# Patient Record
Sex: Female | Born: 1995 | Race: White | Hispanic: No | Marital: Married | Smoking: Former smoker
Health system: Southern US, Community
[De-identification: ages and names within clinical notes are randomized; demographics above are authoritative.]

## PROBLEM LIST (undated history)

## (undated) DIAGNOSIS — O24419 Gestational diabetes mellitus in pregnancy, unspecified control: Secondary | ICD-10-CM

## (undated) DIAGNOSIS — Z789 Other specified health status: Secondary | ICD-10-CM

## (undated) HISTORY — DX: Other specified health status: Z78.9

## (undated) HISTORY — PX: NO PAST SURGERIES: SHX2092

## (undated) HISTORY — DX: Gestational diabetes mellitus in pregnancy, unspecified control: O24.419

## (undated) HISTORY — PX: WISDOM TOOTH EXTRACTION: SHX21

---
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
## 2013-06-09 ENCOUNTER — Ambulatory Visit: Payer: Self-pay | Admitting: Psychiatry

## 2013-06-22 ENCOUNTER — Ambulatory Visit: Payer: Self-pay | Admitting: Psychiatry

## 2013-09-18 ENCOUNTER — Ambulatory Visit: Payer: Self-pay | Admitting: Physician Assistant

## 2015-01-25 ENCOUNTER — Encounter: Payer: Self-pay | Admitting: *Deleted

## 2015-01-30 ENCOUNTER — Encounter: Payer: Self-pay | Admitting: Obstetrics and Gynecology

## 2015-11-17 ENCOUNTER — Ambulatory Visit
Admission: EM | Admit: 2015-11-17 | Discharge: 2015-11-17 | Disposition: A | Discharge status: Home / Self Care | Payer: 59 | Attending: Family Medicine | Admitting: Family Medicine

## 2015-11-17 DIAGNOSIS — J0100 Acute maxillary sinusitis, unspecified: Secondary | ICD-10-CM | POA: Diagnosis not present

## 2015-11-17 DIAGNOSIS — H6093 Unspecified otitis externa, bilateral: Secondary | ICD-10-CM | POA: Diagnosis not present

## 2015-11-17 MED ORDER — CIPROFLOXACIN-DEXAMETHASONE 0.3-0.1 % OT SUSP: 4.0000 [drp] | Freq: Two times a day (BID) | OTIC | Status: AC

## 2015-11-17 MED ORDER — AMOXICILLIN-POT CLAVULANATE 875-125 MG PO TABS: 1.0000 | ORAL_TABLET | Freq: Two times a day (BID) | ORAL | Status: DC

## 2015-11-17 NOTE — ED Notes (Signed)
Patient complains of some ear pain. Patient states that symptoms started 6 days with a cold, since then cold has resolved but ears have still been bothering her. Patient states that she feels like they are full of fluid.

## 2015-11-17 NOTE — Discharge Instructions (Signed)
Take medication as prescribed. Rest. Drink plenty of fluids. Keep ears dry.   Follow up with your primary care physician this week. Return to Urgent care for new or worsening concerns.    Otitis Externa Otitis externa is a bacterial or fungal infection of the outer ear canal. This is the area from the eardrum to the outside of the ear. Otitis externa is sometimes called "swimmer's ear." CAUSES  Possible causes of infection include:  Swimming in dirty water.  Moisture remaining in the ear after swimming or bathing.  Mild injury (trauma) to the ear.  Objects stuck in the ear (foreign body).  Cuts or scrapes (abrasions) on the outside of the ear. SIGNS AND SYMPTOMS  The first symptom of infection is often itching in the ear canal. Later signs and symptoms may include swelling and redness of the ear canal, ear pain, and yellowish-white fluid (pus) coming from the ear. The ear pain may be worse when pulling on the earlobe. DIAGNOSIS  Your health care provider will perform a physical exam. A sample of fluid may be taken from the ear and examined for bacteria or fungi. TREATMENT  Antibiotic ear drops are often given for 10 to 14 days. Treatment may also include pain medicine or corticosteroids to reduce itching and swelling. HOME CARE INSTRUCTIONS   Apply antibiotic ear drops to the ear canal as prescribed by your health care provider.  Take medicines only as directed by your health care provider.  If you have diabetes, follow any additional treatment instructions from your health care provider.  Keep all follow-up visits as directed by your health care provider. PREVENTION   Keep your ear dry. Use the corner of a towel to absorb water out of the ear canal after swimming or bathing.  Avoid scratching or putting objects inside your ear. This can damage the ear canal or remove the protective wax that lines the canal. This makes it easier for bacteria and fungi to grow.  Avoid swimming  in lakes, polluted water, or poorly chlorinated pools.  You may use ear drops made of rubbing alcohol and vinegar after swimming. Combine equal parts of white vinegar and alcohol in a bottle. Put 3 or 4 drops into each ear after swimming. SEEK MEDICAL CARE IF:   You have a fever.  Your ear is still red, swollen, painful, or draining pus after 3 days.  Your redness, swelling, or pain gets worse.  You have a severe headache.  You have redness, swelling, pain, or tenderness in the area behind your ear. MAKE SURE YOU:   Understand these instructions.  Will watch your condition.  Will get help right away if you are not doing well or get worse.   This information is not intended to replace advice given to you by your health care provider. Make sure you discuss any questions you have with your health care provider.   Document Released: 06/08/2005 Document Revised: 06/29/2014 Document Reviewed: 06/25/2011 Elsevier Interactive Patient Education 2016 [employer]  Sinusitis, Adult Sinusitis is redness, soreness, and inflammation of the paranasal sinuses. Paranasal sinuses are air pockets within the bones of your face. They are located beneath your eyes, in the middle of your forehead, and above your eyes. In healthy paranasal sinuses, mucus is able to drain out, and air is able to circulate through them by way of your nose. However, when your paranasal sinuses are inflamed, mucus and air can become trapped. This can allow bacteria and other germs to grow and  cause infection. Sinusitis can develop quickly and last only a short time (acute) or continue over a long period (chronic). Sinusitis that lasts for more than 12 weeks is considered chronic. CAUSES Causes of sinusitis include:  Allergies.  Structural abnormalities, such as displacement of the cartilage that separates your nostrils (deviated septum), which can decrease the air flow through your nose and sinuses and affect sinus  drainage.  Functional abnormalities, such as when the small hairs (cilia) that line your sinuses and help remove mucus do not work properly or are not present. SIGNS AND SYMPTOMS Symptoms of acute and chronic sinusitis are the same. The primary symptoms are pain and pressure around the affected sinuses. Other symptoms include:  Upper toothache.  Earache.  Headache.  Bad breath.  Decreased sense of smell and taste.  A cough, which worsens when you are lying flat.  Fatigue.  Fever.  Thick drainage from your nose, which often is green and may contain pus (purulent).  Swelling and warmth over the affected sinuses. DIAGNOSIS Your health care provider will perform a physical exam. During your exam, your health care provider may perform any of the following to help determine if you have acute sinusitis or chronic sinusitis:  Look in your nose for signs of abnormal growths in your nostrils (nasal polyps).  Tap over the affected sinus to check for signs of infection.  View the inside of your sinuses using an imaging device that has a light attached (endoscope). If your health care provider suspects that you have chronic sinusitis, one or more of the following tests may be recommended:  Allergy tests.  Nasal culture. A sample of mucus is taken from your nose, sent to a lab, and screened for bacteria.  Nasal cytology. A sample of mucus is taken from your nose and examined by your health care provider to determine if your sinusitis is related to an allergy. TREATMENT Most cases of acute sinusitis are related to a viral infection and will resolve on their own within 10 days. Sometimes, medicines are prescribed to help relieve symptoms of both acute and chronic sinusitis. These may include pain medicines, decongestants, nasal steroid sprays, or saline sprays. However, for sinusitis related to a bacterial infection, your health care provider will prescribe antibiotic medicines. These are  medicines that will help kill the bacteria causing the infection. Rarely, sinusitis is caused by a fungal infection. In these cases, your health care provider will prescribe antifungal medicine. For some cases of chronic sinusitis, surgery is needed. Generally, these are cases in which sinusitis recurs more than 3 times per year, despite other treatments. HOME CARE INSTRUCTIONS  Drink plenty of water. Water helps thin the mucus so your sinuses can drain more easily.  Use a humidifier.  Inhale steam 3-4 times a day (for example, sit in the bathroom with the shower running).  Apply a warm, moist washcloth to your face 3-4 times a day, or as directed by your health care provider.  Use saline nasal sprays to help moisten and clean your sinuses.  Take medicines only as directed by your health care provider.  If you were prescribed either an antibiotic or antifungal medicine, finish it all even if you start to feel better. SEEK IMMEDIATE MEDICAL CARE IF:  You have increasing pain or severe headaches.  You have nausea, vomiting, or drowsiness.  You have swelling around your face.  You have vision problems.  You have a stiff neck.  You have difficulty breathing.   This information  is not intended to replace advice given to you by your health care provider. Make sure you discuss any questions you have with your health care provider.   Document Released: 06/08/2005 Document Revised: 06/29/2014 Document Reviewed: 06/23/2011 Elsevier Interactive Patient Education [employer]

## 2015-11-17 NOTE — ED Provider Notes (Signed)
[facility]  ____________________________________________  Time seen: Approximately 3:49 PM  I have reviewed the triage vital signs and the nursing notes.   HISTORY  Chief Complaint Otalgia  HPI Martha Finley is a 20 y.o. female  presents for complaints of 7-8 days of runny nose, runny nose, sinus pressure and sinus drainage. Reports overall her sinus congestion symptoms have improved but continues with sinus discomfort and drainage. however reports in the last 3-4 days she has had increased congestion and fluid filling in both her ears. Reports does continue to present blow her nose and a very thick greenish yellowish nasal drainage out. Reports occasional cough associated with postnasal drainage. Denies fevers.  Denies any head or ear trauma. Denies ringing in the ears. Denies recent swimming or water submersion.   Denies chest pain, shortness of breath, abdominal pain, dysuria, neck pain, back pain, extremity pain or extremity swelling. Patient also reports some history of seasonal allergies. Reports has tried over the counter cough and congestion medications without any relief. Reports continues to eat and drink well. Denies others at home that are sick.   Patient's last menstrual period was 11/12/2015.Denies chance of pregnancy.   History reviewed. No pertinent past medical history.  There are no active problems to display for this patient.   Past Surgical History  Procedure Laterality Date  . No past surgeries      Current Outpatient Rx  Name  Route  Sig  Dispense  Refill  . etonogestrel (NEXPLANON) 68 MG IMPL implant   Subdermal   1 each by Subdermal route once.           Allergies Review of patient's allergies indicates no known allergies.  Family History  Problem Relation Age of Onset  . Diabetes Maternal Grandmother   . COPD Maternal Grandmother     Social History Social History  Substance Use Topics  . Smoking status: Former Games developermoker  . Smokeless  tobacco: Never Used  . Alcohol Use: No    Review of Systems Constitutional: No fever/chills Eyes: No visual changes. ENT:As Above.  Cardiovascular: Denies chest pain. Respiratory: Denies shortness of breath. Gastrointestinal: No abdominal pain.  No nausea, no vomiting.  No diarrhea.  No constipation. Genitourinary: Negative for dysuria. Musculoskeletal: Negative for back pain. Skin: Negative for rash. Neurological: Negative for headaches, focal weakness or numbness.  10-point ROS otherwise negative.  ____________________________________________   PHYSICAL EXAM:  VITAL SIGNS: ED Triage Vitals  Enc Vitals Group     BP 11/17/15 1522 123/68 mmHg     Pulse Rate 11/17/15 1522 91     Resp 11/17/15 1522 17     Temp 11/17/15 1522 98.2 F (36.8 C)     Temp Source 11/17/15 1522 Oral     SpO2 11/17/15 1522 99 %     Weight 11/17/15 1522 170 lb (77.111 kg)     Height 11/17/15 1522 5\' 6"  (1.676 m)     Head Cir --      Peak Flow --      Pain Score 11/17/15 1524 0     Pain Loc --      Pain Edu? --      Excl. in GC? --   Constitutional: Alert and oriented. Well appearing and in no acute distress. Eyes: Conjunctivae are normal. PERRL. EOMI. Head: Atraumatic.Mild to moderate tenderness to palpation bilateral frontal and maxillary sinuses. No swelling. No erythema.   Ears: Bilateral ear canals with mild swelling and mild whitish discharge, bilateral TMs nonerythematous and  normal appearing. Bilateral TMs appear intact. No tenderness,  erythema or swelling bilaterally. Hearing grossly intact bilaterally.   Nose: nasal congestion with bilateral nasal turbinate erythema and edema.   Mouth/Throat: Mucous membranes are moist.  Oropharynx non-erythematous.No tonsillar swelling or exudate.  Neck: No stridor.  No cervical spine tenderness to palpation. Hematological/Lymphatic/Immunilogical: No cervical lymphadenopathy. Cardiovascular: Normal rate, regular rhythm. Grossly normal heart sounds.   Good peripheral circulation. Respiratory: Normal respiratory effort.  No retractions. Lungs CTAB. No wheezes, rales or rhonchi. Good air movement.  Gastrointestinal: Soft and nontender. No distention. Normal Bowel sounds. No CVA tenderness. Musculoskeletal: No lower or upper extremity tenderness nor edema.   No cervical, thoracic or lumbar tenderness to palpation.  Neurologic:  Normal speech and language. No gross focal neurologic deficits are appreciated. No gait instability. Skin:  Skin is warm, dry and intact. No rash noted. Psychiatric: Mood and affect are normal. Speech and behavior are normal.  ____________________________________________   LABS (all labs ordered are listed, but only abnormal results are displayed)  Labs Reviewed - No data to display   INITIAL IMPRESSION / ASSESSMENT AND PLAN / ED COURSE  Pertinent labs & imaging results that were available during my care of the patient were reviewed by me and considered in my medical decision making (see chart for detail.)  Well-appearing patient. No acute distress. Presents for complaint of one week of runny nose, nasal congestion, sinus pressure and sinus drainage with gradual onset of bilateral ear discomfort. Suspect maxillary sinusitis with bilateral mild otitis externa. Will treat with oral Augmentin and Ciprodex. Encouraged rest, fluids, over-the-counter Tylenol or ibuprofen as needed. Encourage PCP follow-up as needed.Discussed indication, risks and benefits of medications with patient.  Discussed follow up with Primary care physician this week. Discussed follow up and return parameters including no resolution or any worsening concerns. Patient verbalized understanding and agreed to plan.   ____________________________________________   FINAL CLINICAL IMPRESSION(S) / ED DIAGNOSES  Final diagnoses:  Acute maxillary sinusitis, recurrence not specified  External otitis, bilateral     Discharge Medication List as of  11/17/2015  3:53 PM    START taking these medications   Details  amoxicillin-clavulanate (AUGMENTIN) 875-125 MG tablet Take 1 tablet by mouth every 12 (twelve) hours., Starting 11/17/2015, Until Discontinued, Normal    ciprofloxacin-dexamethasone (CIPRODEX) otic suspension Place 4 drops into both ears 2 (two) times daily. For one week, Starting 11/17/2015, Until Sun 11/24/15, Normal        Note: This dictation was prepared with Dragon dictation along with smaller phrase technology. Any transcriptional errors that result from this process are unintentional.       Renford Dills, NP 11/17/15 1941  Renford Dills, NP 11/17/15 1941

## 2016-08-21 ENCOUNTER — Ambulatory Visit
Admission: EM | Admit: 2016-08-21 | Discharge: 2016-08-21 | Disposition: A | Discharge status: Home / Self Care | Payer: No Typology Code available for payment source | Attending: Family Medicine | Admitting: Family Medicine

## 2016-08-21 ENCOUNTER — Encounter: Payer: Self-pay | Admitting: *Deleted

## 2016-08-21 ENCOUNTER — Ambulatory Visit (INDEPENDENT_AMBULATORY_CARE_PROVIDER_SITE_OTHER): Payer: No Typology Code available for payment source

## 2016-08-21 DIAGNOSIS — J101 Influenza due to other identified influenza virus with other respiratory manifestations: Secondary | ICD-10-CM | POA: Diagnosis not present

## 2016-08-21 DIAGNOSIS — R6889 Other general symptoms and signs: Secondary | ICD-10-CM

## 2016-08-21 LAB — RAPID INFLUENZA A&B ANTIGENS
Influenza A (ARMC): POSITIVE — AB
Influenza B (ARMC): NEGATIVE

## 2016-08-21 MED ORDER — FEXOFENADINE-PSEUDOEPHED ER 180-240 MG PO TB24: 1.0000 | ORAL_TABLET | Freq: Every day | ORAL | 0 refills | Status: DC

## 2016-08-21 MED ORDER — IBUPROFEN 400 MG PO TABS
400.0000 mg | ORAL_TABLET | Freq: Once | ORAL | Status: AC
  Administered 2016-08-21: 400 mg via ORAL

## 2016-08-21 MED ORDER — HYDROCOD POLST-CPM POLST ER 10-8 MG/5ML PO SUER: 5.0000 mL | Freq: Two times a day (BID) | ORAL | 0 refills | Status: DC | PRN

## 2016-08-21 MED ORDER — OSELTAMIVIR PHOSPHATE 75 MG PO CAPS: 75.0000 mg | ORAL_CAPSULE | Freq: Two times a day (BID) | ORAL | 0 refills | Status: DC

## 2016-08-21 NOTE — ED Provider Notes (Signed)
[facility]    CSN: [id number] Arrival date & time: 08/21/16  1121     History   Chief Complaint Chief Complaint  Patient presents with  . Cough  . Fever  . Generalized Body Aches    HPI Martha Finley is a 21 y.o. female.   Patient is a 21 year old white female reports happens more mild coughing since she had a cough about 2 weeks ago cleared in about 3 days ago she started coughing again came back worse. And now today started having fever aching  just a slight sore throat. She states that she's aching she states that she takes when she coughs more she's not coughing there is no aching in her arms and legs don't hurt. She states she was told when she got here she had a fever she had no fever home. She does not smoke no known drug allergies. Subcutaneous was disconcerting was the fact that she's not aching all over and just in her chest and her when she takes deep breath. She does not smoke now but she is a former smoker no known drug allergies no chronic medical problems. His diabetes and COPD maternal grandmother.   The history is provided by the patient. No language interpreter was used.  Cough  Cough characteristics:  Productive Sputum characteristics:  Nondescript Severity:  Moderate Timing:  Constant Progression:  Worsening Chronicity:  New Smoker: no   Context: upper respiratory infection   Relieved by:  Nothing Worsened by:  Deep breathing Associated symptoms: chills, fever, myalgias, shortness of breath and sinus congestion   Risk factors: recent infection   Fever  Associated symptoms: chills, congestion, cough and myalgias     History reviewed. No pertinent past medical history.  There are no active problems to display for this patient.   Past Surgical History:  Procedure Laterality Date  . NO PAST SURGERIES      OB History    No data available       Home Medications    Prior to Admission medications   Medication Sig Start Date End Date  Taking? Authorizing Provider  amoxicillin-clavulanate (AUGMENTIN) 875-125 MG tablet Take 1 tablet by mouth every 12 (twelve) hours. 11/17/15   Renford Dills, NP  chlorpheniramine-HYDROcodone [facility] PENNKINETIC ER) 10-8 MG/5ML SUER Take 5 mLs by mouth every 12 (twelve) hours as needed. 08/21/16   Hassan Rowan, MD  etonogestrel (NEXPLANON) 68 MG IMPL implant 1 each by Subdermal route once.    Historical Provider, MD  fexofenadine-pseudoephedrine (ALLEGRA-D 24) 180-240 MG 24 hr tablet Take 1 tablet by mouth daily. 08/21/16   Hassan Rowan, MD  oseltamivir (TAMIFLU) 75 MG capsule Take 1 capsule (75 mg total) by mouth 2 (two) times daily. 08/21/16   Hassan Rowan, MD    Family History Family History  Problem Relation Age of Onset  . Diabetes Maternal Grandmother   . COPD Maternal Grandmother     Social History Social History  Substance Use Topics  . Smoking status: Former Games developer  . Smokeless tobacco: Never Used  . Alcohol use No     Allergies   Patient has no known allergies.   Review of Systems Review of Systems  Constitutional: Positive for chills and fever.  HENT: Positive for congestion.   Respiratory: Positive for cough and shortness of breath.   Musculoskeletal: Positive for myalgias.  All other systems reviewed and are negative.    Physical Exam Triage Vital Signs ED Triage Vitals  Enc Vitals Group  BP 08/21/16 1142 (!) 158/90     Pulse Rate 08/21/16 1142 (!) 109     Resp 08/21/16 1142 16     Temp 08/21/16 1142 (!) 102 F (38.9 C)     Temp Source 08/21/16 1142 Oral     SpO2 08/21/16 1142 100 %     Weight 08/21/16 1144 175 lb (79.4 kg)     Height 08/21/16 1144 5\' 6"  (1.676 m)     Head Circumference --      Peak Flow --      Pain Score 08/21/16 1145 0     Pain Loc --      Pain Edu? --      Excl. in GC? --    No data found.   Updated Vital Signs BP (!) 158/90 (BP Location: Right Arm)   Pulse (!) 109   Temp (!) 102 F (38.9 C) (Oral)   Resp 16   Ht 5\' 6"   (1.676 m)   Wt 175 lb (79.4 kg)   LMP 08/12/2016 Comment: denies preg  SpO2 100%   BMI 28.25 kg/m   Visual Acuity Right Eye Distance:   Left Eye Distance:   Bilateral Distance:    Right Eye Near:   Left Eye Near:    Bilateral Near:     Physical Exam  Constitutional: She appears well-developed and well-nourished. She appears distressed.  HENT:  Head: Normocephalic and atraumatic.  Right Ear: Hearing, tympanic membrane, external ear and ear canal normal.  Left Ear: Hearing, tympanic membrane, external ear and ear canal normal.  Nose: Nose normal. No mucosal edema or rhinorrhea.  Mouth/Throat: Uvula is midline, oropharynx is clear and moist and mucous membranes are normal.  Eyes: Pupils are equal, round, and reactive to light.  Neck: Normal range of motion. Neck supple.  Cardiovascular: Normal rate, regular rhythm and normal heart sounds.   Pulmonary/Chest: Effort normal and breath sounds normal.  Neurological: She is alert.  Skin: Skin is warm.  Psychiatric: She has a normal mood and affect.  Vitals reviewed.    UC Treatments / Results  Labs (all labs ordered are listed, but only abnormal results are displayed) Labs Reviewed  RAPID INFLUENZA A&B ANTIGENS ([facility] ONLY) - Abnormal; Notable for the following:       Result Value   Influenza A ([facility]) POSITIVE (*)    All other components within normal limits    EKG  EKG Interpretation None       Radiology Dg Chest 2 View  Result Date: 08/21/2016 CLINICAL DATA:  Cough and fever EXAM: CHEST  2 VIEW COMPARISON:  None. FINDINGS: The heart size and mediastinal contours are within normal limits. Both lungs are clear. The visualized skeletal structures are unremarkable. IMPRESSION: No active cardiopulmonary disease. Electronically Signed   By: Tollie Eth M.D.   On: 08/21/2016 13:03    Procedures Procedures (including critical care time)  Medications Ordered in UC Medications  ibuprofen (ADVIL,MOTRIN) tablet 400 mg (400  mg Oral Given 08/21/16 1151)    Results for orders placed or performed during the hospital encounter of 08/21/16  Rapid Influenza A&B Antigens ([facility] only)  Result Value Ref Range   Influenza A ([facility]) POSITIVE (A) NEGATIVE   Influenza B ([facility]) NEGATIVE NEGATIVE   Initial Impression / Assessment and Plan / UC Course  I have reviewed the triage vital signs and the nursing notes.  Pertinent labs & imaging results that were available during my care of the patient were reviewed by me  and considered in my medical decision making (see chart for details).     Concerned about the history since she only has chest achiness despite she calls a chest x-ray was done which was negative. Flu test that was positive indicating that this is the flu and not a pneumonia. We'll place on Tamiflu for the flu test next the cough 1 teaspoon twice a day Allegra-D for congestion and follow-up PCP if not better in a week work note given for today and tomorrow as well.  Final Clinical Impressions(s) / UC Diagnoses   Final diagnoses:  Flu-like symptoms  Influenza A    New Prescriptions New Prescriptions   CHLORPHENIRAMINE-HYDROCODONE (TUSSIONEX PENNKINETIC ER) 10-8 MG/5ML SUER    Take 5 mLs by mouth every 12 (twelve) hours as needed.   FEXOFENADINE-PSEUDOEPHEDRINE (ALLEGRA-D 24) 180-240 MG 24 HR TABLET    Take 1 tablet by mouth daily.   OSELTAMIVIR (TAMIFLU) 75 MG CAPSULE    Take 1 capsule (75 mg total) by mouth 2 (two) times daily.    Note: This dictation was prepared with Dragon dictation along with smaller phrase technology. Any transcriptional errors that result from this process are unintentional.   Hassan RowanEugene Dalonte Hardage, MD 08/21/16 1334

## 2016-08-21 NOTE — ED Triage Notes (Signed)
Patient started having symptom of cough 5 days ago and started having fever with body aches yesterday.

## 2017-03-17 IMAGING — CR DG CHEST 2V
2 series · 2 of 2 positions shown · non-contrast
Comparison: None.

CLINICAL DATA: Cough and fever

EXAM:
CHEST  2 VIEW

[chest pa]
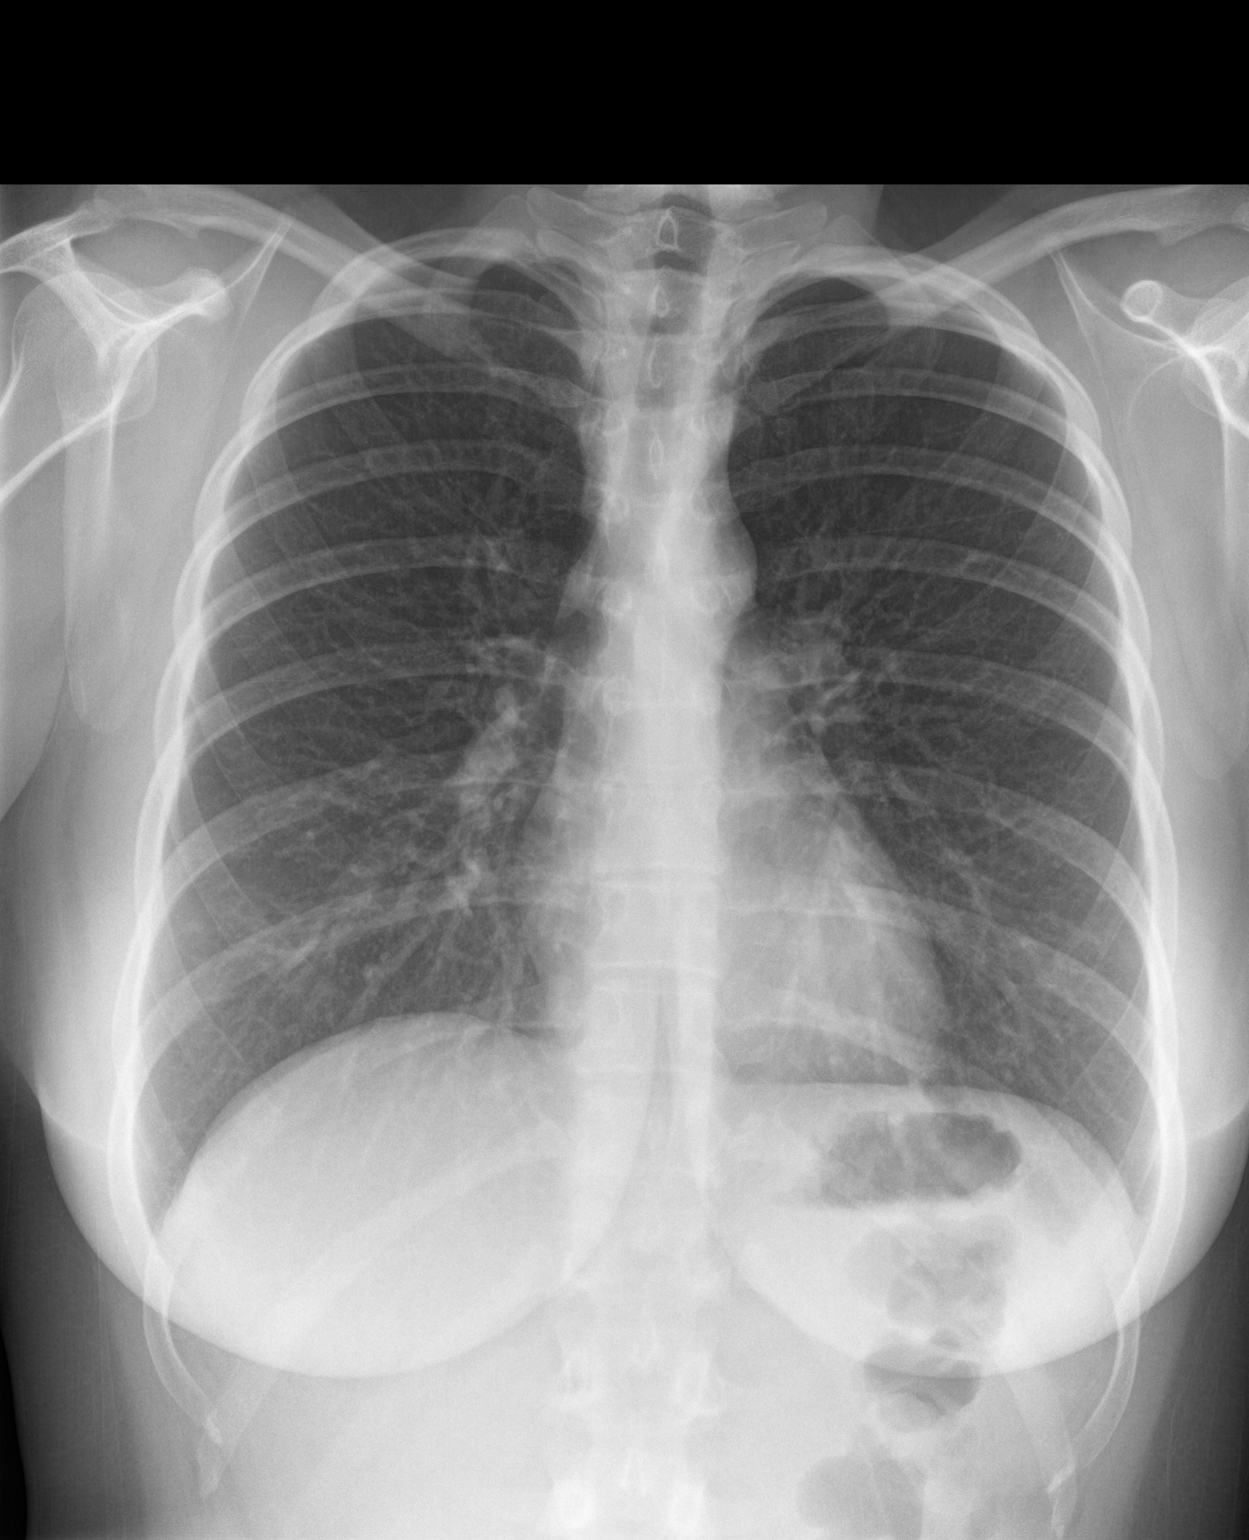

[chest lat]
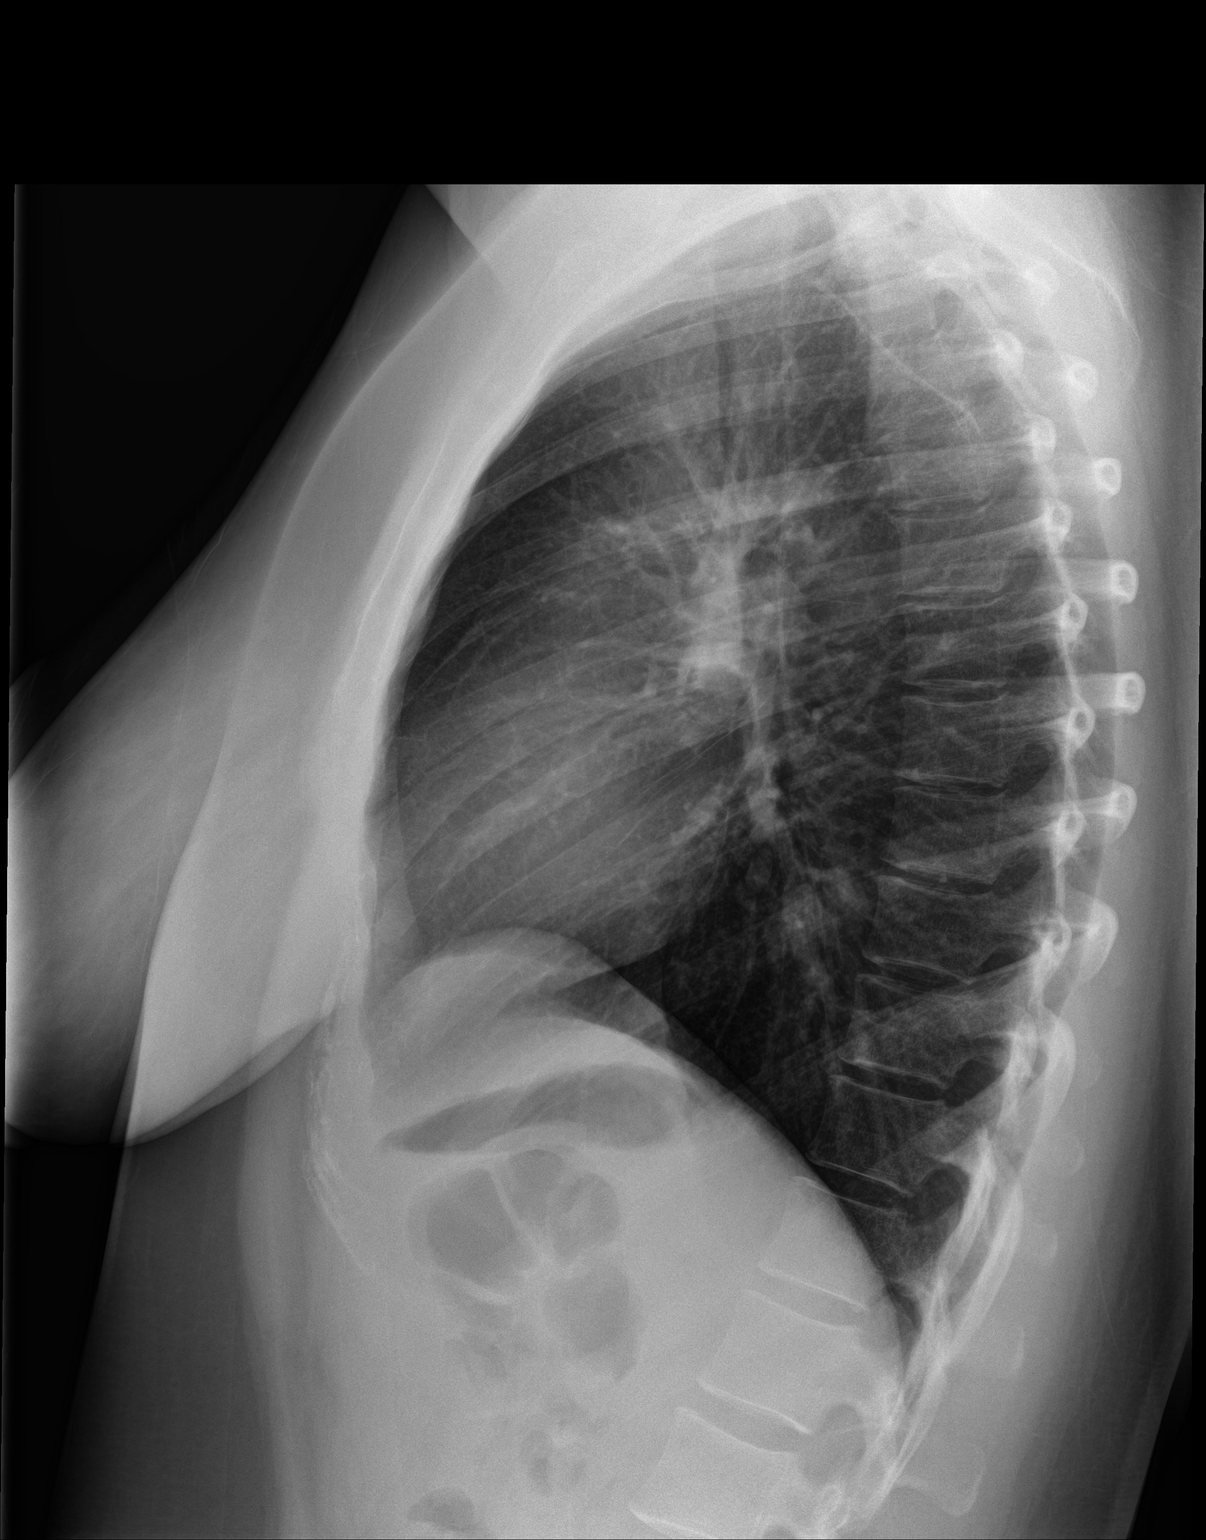

[2 of 2 positions shown; findings below may reference images not displayed]

FINDINGS: The heart size and mediastinal contours are within normal limits.
Both lungs are clear. The visualized skeletal structures are
unremarkable.
IMPRESSION: No active cardiopulmonary disease.

## 2018-07-04 ENCOUNTER — Encounter: Payer: Self-pay | Admitting: Certified Nurse Midwife

## 2018-07-04 ENCOUNTER — Ambulatory Visit (INDEPENDENT_AMBULATORY_CARE_PROVIDER_SITE_OTHER): Payer: BLUE CROSS/BLUE SHIELD | Admitting: Certified Nurse Midwife

## 2018-07-04 VITALS — BP 126/85 | HR 70 | Ht 66.0 in | Wt 178.1 lb

## 2018-07-04 DIAGNOSIS — Z3046 Encounter for surveillance of implantable subdermal contraceptive: Secondary | ICD-10-CM

## 2018-07-04 DIAGNOSIS — Z3049 Encounter for surveillance of other contraceptives: Secondary | ICD-10-CM | POA: Diagnosis not present

## 2018-07-04 MED ORDER — NORETHIN ACE-ETH ESTRAD-FE 1-20 MG-MCG PO TABS: 1.0000 | ORAL_TABLET | Freq: Every day | ORAL | 11 refills | Status: DC

## 2018-07-04 NOTE — Progress Notes (Signed)
Martha Finley is a 23 y.o. year old No obstetric history on file. Caucasian female here for Nexplanon removal and reinsertion.  She was given informed consent for removal. Her Nexplanon was placed 4 yrs ago, Patient's last menstrual period was 06/27/2018 (exact date).,   BP 126/85   Pulse 70   Ht 5\' 6"  (1.676 m)   Wt 178 lb 1 oz (80.8 kg)   LMP 06/27/2018 (Exact Date)   BMI 28.74 kg/m  Patient's last menstrual period was 06/27/2018 (exact date). No results found for this or any previous visit (from the past 24 hour(s)).   Appropriate time out taken. Nexplanon site identified.  Area prepped in usual sterile fashon. Two cc's of 2% lidocaine was used to anesthetize the area. A small stab incision was made right beside the implant on the distal portion.  The Nexplanon rod was grasped using hemostats and removed intact without difficulty.    Steri-strips and a pressure bandage was applied.  There was less than 3 cc blood loss. There were no complications.  The patient tolerated the procedure well.  She was instructed to keep the area clean and dry, remove pressure bandage in 24 hours, and keep insertion site covered with the steri-strips for 3-5 days. Reviewed options for [facility] she would like to start the pill. She has taken in the past with no problems. She denies history of cardiovascular disease, stroke, clotting d/o or cancer. Order placed for lo estrin pill   Follow-up PRN problems.  Doreene Burke, CNM

## 2018-07-04 NOTE — Patient Instructions (Signed)

## 2018-07-12 ENCOUNTER — Ambulatory Visit (INDEPENDENT_AMBULATORY_CARE_PROVIDER_SITE_OTHER): Payer: BLUE CROSS/BLUE SHIELD | Admitting: Certified Nurse Midwife

## 2018-07-12 ENCOUNTER — Other Ambulatory Visit (HOSPITAL_COMMUNITY)
Admission: RE | Admit: 2018-07-12 | Discharge: 2018-07-12 | Disposition: A | Discharge status: Home / Self Care | Payer: BLUE CROSS/BLUE SHIELD | Source: Ambulatory Visit | Attending: Certified Nurse Midwife | Admitting: Certified Nurse Midwife

## 2018-07-12 ENCOUNTER — Encounter: Payer: Self-pay | Admitting: Certified Nurse Midwife

## 2018-07-12 VITALS — BP 129/87 | HR 79 | Ht 66.0 in | Wt 176.2 lb

## 2018-07-12 DIAGNOSIS — Z01419 Encounter for gynecological examination (general) (routine) without abnormal findings: Secondary | ICD-10-CM

## 2018-07-12 NOTE — Patient Instructions (Signed)
Preventive Care 18-39 Years, Female Preventive care refers to lifestyle choices and visits with your health care provider that can promote health and wellness. What does preventive care include?   A yearly physical exam. This is also called an annual well check.  Dental exams once or twice a year.  Routine eye exams. Ask your health care provider how often you should have your eyes checked.  Personal lifestyle choices, including: ? Daily care of your teeth and gums. ? Regular physical activity. ? Eating a healthy diet. ? Avoiding tobacco and drug use. ? Limiting alcohol use. ? Practicing safe sex. ? Taking vitamin and mineral supplements as recommended by your health care provider. What happens during an annual well check? The services and screenings done by your health care provider during your annual well check will depend on your age, overall health, lifestyle risk factors, and family history of disease. Counseling Your health care provider may ask you questions about your:  Alcohol use.  Tobacco use.  Drug use.  Emotional well-being.  Home and relationship well-being.  Sexual activity.  Eating habits.  Work and work environment.  Method of birth control.  Menstrual cycle.  Pregnancy history. Screening You may have the following tests or measurements:  Height, weight, and BMI.  Diabetes screening. This is done by checking your blood sugar (glucose) after you have not eaten for a while (fasting).  Blood pressure.  Lipid and cholesterol levels. These may be checked every 5 years starting at age 20.  Skin check.  Hepatitis C blood test.  Hepatitis B blood test.  Sexually transmitted disease (STD) testing.  BRCA-related cancer screening. This may be done if you have a family history of breast, ovarian, tubal, or peritoneal cancers.  Pelvic exam and Pap test. This may be done every 3 years starting at age 21. Starting at age 30, this may be done every 5  years if you have a Pap test in combination with an HPV test. Discuss your test results, treatment options, and if necessary, the need for more tests with your health care provider. Vaccines Your health care provider may recommend certain vaccines, such as:  Influenza vaccine. This is recommended every year.  Tetanus, diphtheria, and acellular pertussis (Tdap, Td) vaccine. You may need a Td booster every 10 years.  Varicella vaccine. You may need this if you have not been vaccinated.  HPV vaccine. If you are 26 or younger, you may need three doses over 6 months.  Measles, mumps, and rubella (MMR) vaccine. You may need at least one dose of MMR. You may also need a second dose.  Pneumococcal 13-valent conjugate (PCV13) vaccine. You may need this if you have certain conditions and were not previously vaccinated.  Pneumococcal polysaccharide (PPSV23) vaccine. You may need one or two doses if you smoke cigarettes or if you have certain conditions.  Meningococcal vaccine. One dose is recommended if you are age 19-21 years and a first-year college student living in a residence hall, or if you have one of several medical conditions. You may also need additional booster doses.  Hepatitis A vaccine. You may need this if you have certain conditions or if you travel or work in places where you may be exposed to hepatitis A.  Hepatitis B vaccine. You may need this if you have certain conditions or if you travel or work in places where you may be exposed to hepatitis B.  Haemophilus influenzae type b (Hib) vaccine. You may need this if you   have certain risk factors. Talk to your health care provider about which screenings and vaccines you need and how often you need them. This information is not intended to replace advice given to you by your health care provider. Make sure you discuss any questions you have with your health care provider. Document Released: 08/04/2001 Document Revised: 01/19/2017  Document Reviewed: 04/09/2015 Elsevier Interactive Patient Education  2019 [employer].

## 2018-07-12 NOTE — Progress Notes (Signed)
GYNECOLOGY ANNUAL PREVENTATIVE CARE ENCOUNTER NOTE  Subjective:   Martha Finley is a 23 y.o. G0P0000 female here for a routine annual gynecologic exam.  Current complaints: none   Denies abnormal vaginal bleeding, discharge, pelvic pain, problems with intercourse or other gynecologic concerns. Is on bariatric plan for weight loss. Gets vitamin B 12 injections and phentamine for weight loss is 10 lbs from goal weight.    Gynecologic History Patient's last menstrual period was 06/27/2018 (exact date). Contraception: Nexplanon Last Pap: several yrs ago. Results were: normal per pt. Last mammogram: N/A. Results were: normal  Obstetric History OB History  Gravida Para Term Preterm AB Living  0 0 0 0 0 0  SAB TAB Ectopic Multiple Live Births  0 0 0 0 0    Past Medical History:  Diagnosis Date  . Known health problems: none     Past Surgical History:  Procedure Laterality Date  . NO PAST SURGERIES    . WISDOM TOOTH EXTRACTION      Current Outpatient Medications on File Prior to Visit  Medication Sig Dispense Refill  . cyanocobalamin (,VITAMIN B-12,) 1000 MCG/ML injection Inject 1,000 mcg into the muscle once.    . etonogestrel (NEXPLANON) 68 MG IMPL implant 1 each by Subdermal route once.    . norethindrone-ethinyl estradiol (JUNEL FE,GILDESS FE,LOESTRIN FE) 1-20 MG-MCG tablet Take 1 tablet by mouth daily. 1 Package 11  . phentermine (ADIPEX-P) 37.5 MG tablet Take 37.5 mg by mouth daily before breakfast.     No current facility-administered medications on file prior to visit.     No Known Allergies  Social History:  reports that she has quit smoking. She has never used smokeless tobacco. She reports that she does not drink alcohol or use drugs.  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 Diabetes Maternal  Grandmother   . COPD Maternal Grandmother   . Breast cancer Maternal Grandmother     The following portions of the patient's history were reviewed and updated as appropriate: allergies, current medications, past family history, past medical history, past social history, past surgical history and problem list.  Review of Systems Pertinent items noted in HPI and remainder of comprehensive ROS otherwise negative.   Objective:  LMP 06/27/2018 (Exact Date)  CONSTITUTIONAL: Well-developed, well-nourished female in no acute distress.  HENT:  Normocephalic, atraumatic, External right and left ear normal. Oropharynx is clear and moist EYES: Conjunctivae and EOM are normal. Pupils are equal, round, and reactive to light. No scleral icterus.  NECK: Normal range of motion, supple, no masses.  Normal thyroid.  SKIN: Skin is warm and dry. No rash noted. Not diaphoretic. No erythema. No pallor. MUSCULOSKELETAL: Normal range of motion. No tenderness.  No cyanosis, clubbing, or edema.  2+ distal pulses. NEUROLOGIC: Alert and oriented to person, place, and time. Normal reflexes, muscle tone coordination. No cranial nerve deficit noted. PSYCHIATRIC: Normal mood and affect. Normal behavior. Normal judgment and thought content. CARDIOVASCULAR: Normal heart rate noted, regular rhythm RESPIRATORY: Clear to auscultation bilaterally. Effort and breath sounds normal, no problems with respiration noted. BREASTS: Symmetric in size. No masses, skin changes, nipple drainage, or lymphadenopathy. ABDOMEN: Soft, normal bowel sounds, no distention noted.  No tenderness, rebound or guarding.  PELVIC: Normal appearing external genitalia; normal appearing vaginal mucosa and cervix.  No abnormal discharge noted.  Pap smear obtained. Contact bleeding present.  Normal uterine size, no other palpable masses, no uterine or adnexal tenderness.  Assessment and Plan:  Women's annual routine GYN exam Will follow up results of pap  smear and manage accordingly. Mammogram not indicated Labs; States she had cholesterol checked at bariatric clinic  Routine preventative health maintenance measures emphasized. Please refer to After Visit Summary for other counseling recommendations.   Doreene Burke, CNM

## 2018-07-18 ENCOUNTER — Other Ambulatory Visit: Payer: Self-pay | Admitting: Certified Nurse Midwife

## 2018-07-18 LAB — CYTOLOGY - PAP
BACTERIAL VAGINITIS: POSITIVE — AB
Candida vaginitis: NEGATIVE
Chlamydia: NEGATIVE
Diagnosis: NEGATIVE
NEISSERIA GONORRHEA: NEGATIVE
TRICH (WINDOWPATH): NEGATIVE

## 2018-07-18 LAB — CERVICOVAGINAL ANCILLARY ONLY: HERPES (WINDOWPATH): NEGATIVE

## 2018-07-18 MED ORDER — METRONIDAZOLE 500 MG PO TABS: 500.0000 mg | ORAL_TABLET | Freq: Two times a day (BID) | ORAL | 0 refills | Status: AC

## 2018-07-18 NOTE — Progress Notes (Signed)
Pap smear positive for BV, orders placed for treatment. Pt notified via my chart.  Doreene Burke, CNM

## 2019-02-03 ENCOUNTER — Other Ambulatory Visit: Payer: Self-pay

## 2019-02-03 MED ORDER — METRONIDAZOLE 500 MG PO TABS: 500.0000 mg | ORAL_TABLET | Freq: Two times a day (BID) | ORAL | 0 refills | Status: DC

## 2019-05-01 ENCOUNTER — Other Ambulatory Visit: Payer: Self-pay | Admitting: Certified Nurse Midwife

## 2019-12-23 ENCOUNTER — Emergency Department
Admission: EM | Admit: 2019-12-23 | Discharge: 2019-12-23 | Disposition: A | Discharge status: Home / Self Care | Payer: BC Managed Care – PPO | Attending: Emergency Medicine | Admitting: Emergency Medicine

## 2019-12-23 ENCOUNTER — Encounter: Payer: Self-pay | Admitting: Intensive Care

## 2019-12-23 ENCOUNTER — Other Ambulatory Visit: Payer: Self-pay

## 2019-12-23 ENCOUNTER — Emergency Department: Payer: BC Managed Care – PPO

## 2019-12-23 DIAGNOSIS — O99331 Smoking (tobacco) complicating pregnancy, first trimester: Secondary | ICD-10-CM | POA: Insufficient documentation

## 2019-12-23 DIAGNOSIS — Z3A01 Less than 8 weeks gestation of pregnancy: Secondary | ICD-10-CM | POA: Diagnosis not present

## 2019-12-23 DIAGNOSIS — F17210 Nicotine dependence, cigarettes, uncomplicated: Secondary | ICD-10-CM | POA: Insufficient documentation

## 2019-12-23 DIAGNOSIS — O209 Hemorrhage in early pregnancy, unspecified: Secondary | ICD-10-CM | POA: Diagnosis present

## 2019-12-23 DIAGNOSIS — N939 Abnormal uterine and vaginal bleeding, unspecified: Secondary | ICD-10-CM

## 2019-12-23 LAB — COMPREHENSIVE METABOLIC PANEL
ALT: 13 U/L (ref 0–44)
AST: 16 U/L (ref 15–41)
Albumin: 4.5 g/dL (ref 3.5–5.0)
Alkaline Phosphatase: 53 U/L (ref 38–126)
Anion gap: 11 (ref 5–15)
BUN: 16 mg/dL (ref 6–20)
CO2: 19 mmol/L — ABNORMAL LOW (ref 22–32)
Calcium: 9.2 mg/dL (ref 8.9–10.3)
Chloride: 106 mmol/L (ref 98–111)
Creatinine, Ser: 0.77 mg/dL (ref 0.44–1.00)
GFR calc Af Amer: >60 mL/min (ref >60)
GFR calc non Af Amer: >60 mL/min (ref >60)
Glucose, Bld: 90 mg/dL (ref 70–99)
Potassium: 3.5 mmol/L (ref 3.5–5.1)
Sodium: 136 mmol/L (ref 135–145)
Total Bilirubin: 0.7 mg/dL (ref 0.3–1.2)
Total Protein: 7.9 g/dL (ref 6.5–8.1)

## 2019-12-23 LAB — HCG, QUANTITATIVE, PREGNANCY: hCG, Beta Chain, Quant, S: 6712 m[IU]/mL — ABNORMAL HIGH (ref <5)

## 2019-12-23 LAB — URINALYSIS, COMPLETE (UACMP) WITH MICROSCOPIC
Bacteria, UA: NONE SEEN
Bilirubin Urine: NEGATIVE
Glucose, UA: NEGATIVE mg/dL
Ketones, ur: 20 mg/dL — AB
Leukocytes,Ua: NEGATIVE
Nitrite: NEGATIVE
Protein, ur: NEGATIVE mg/dL
Specific Gravity, Urine: 1.013 (ref 1.005–1.030)
pH: 5 (ref 5.0–8.0)

## 2019-12-23 LAB — CBC
HCT: 38 % (ref 36.0–46.0)
Hemoglobin: 13.2 g/dL (ref 12.0–15.0)
MCH: 29 pg (ref 26.0–34.0)
MCHC: 34.7 g/dL (ref 30.0–36.0)
MCV: 83.5 fL (ref 80.0–100.0)
Platelets: 271 10*3/uL (ref 150–400)
RBC: 4.55 MIL/uL (ref 3.87–5.11)
RDW: 12.3 % (ref 11.5–15.5)
WBC: 11 10*3/uL — ABNORMAL HIGH (ref 4.0–10.5)
nRBC: 0 % (ref 0.0–0.2)

## 2019-12-23 LAB — POCT PREGNANCY, URINE: Preg Test, Ur: POSITIVE — AB

## 2019-12-23 LAB — ABO/RH: ABO/RH(D): A POS

## 2019-12-23 LAB — LIPASE, BLOOD: Lipase: 25 U/L (ref 11–51)

## 2019-12-23 NOTE — Discharge Instructions (Addendum)
Please seek medical attention for any high fevers, chest pain, shortness of breath, change in behavior, persistent vomiting, bloody stool or any other new or concerning symptoms.  

## 2019-12-23 NOTE — ED Notes (Signed)
Pt returned from [facility]

## 2019-12-23 NOTE — ED Provider Notes (Signed)
[facility] Emergency Department Provider Note  ____________________________________________   I have reviewed the triage vital signs and the nursing notes.   HISTORY  Chief Complaint Vaginal bleeding  History limited by: Not Limited   HPI Martha Finley is a 24 y.o. female who presents to the emergency department today because of concerns for vaginal bleeding and passing of a blood clot today. This was accompanied by some pelvic cramping. At the time my exam she states that the cramping has resolved. Her last menstrual cycle was roughly 2 months ago. She took a home pregnancy test earlier this week which was positive. This is the patient's first pregnancy. Denies any chronic medical disease or bleeding disorder.   Records reviewed.  Past Medical History:  Diagnosis Date  . Known health problems: none     There are no problems to display for this patient.   Past Surgical History:  Procedure Laterality Date  . NO PAST SURGERIES    . WISDOM TOOTH EXTRACTION      Prior to Admission medications   Medication Sig Start Date End Date Taking? Authorizing Provider  cyanocobalamin (,VITAMIN B-12,) 1000 MCG/ML injection Inject 1,000 mcg into the muscle once.    [provider]  metroNIDAZOLE (FLAGYL) 500 MG tablet Take 1 tablet (500 mg total) by mouth 2 (two) times daily. 02/03/19   Doreene Burke, CNM  norethindrone-ethinyl estradiol (JUNEL FE,GILDESS FE,LOESTRIN FE) 1-20 MG-MCG tablet Take 1 tablet by mouth daily. 07/04/18   Doreene Burke, CNM  phentermine (ADIPEX-P) 37.5 MG tablet Take 37.5 mg by mouth daily before breakfast.    [provider]    Allergies Patient has no known allergies.  Family History  Problem Relation Age of Onset  . Diabetes Maternal Grandmother   . COPD Maternal Grandmother   . Breast cancer Maternal Grandmother     Social History Social History   Tobacco Use  . Smoking status: Current Every Day Smoker     Types: Cigarettes, E-cigarettes  . Smokeless tobacco: Never Used  Vaping Use  . Vaping Use: Every day  Substance Use Topics  . Alcohol use: Yes    Alcohol/week: 0.0 standard drinks    Comment: rare  . Drug use: Yes    Types: Marijuana    Review of Systems Constitutional: No fever/chills Eyes: No visual changes. ENT: No sore throat. Cardiovascular: Denies chest pain. Respiratory: Denies shortness of breath. Gastrointestinal: Positive for pelvic cramping Genitourinary: Positive for vaginal bleeding. Musculoskeletal: Negative for back pain. Skin: Negative for rash. Neurological: Negative for headaches, focal weakness or numbness.  ____________________________________________   PHYSICAL EXAM:  VITAL SIGNS: ED Triage Vitals  Enc Vitals Group     BP 12/23/19 1645 136/90     Pulse Rate 12/23/19 1645 98     Resp 12/23/19 1645 16     Temp 12/23/19 1645 98.8 F (37.1 C)     Temp Source 12/23/19 1645 Oral     SpO2 12/23/19 1645 99 %     Weight 12/23/19 1646 180 lb (81.6 kg)     Height 12/23/19 1646 5\' 6"  (1.676 m)     Head Circumference --      Peak Flow --      Pain Score 12/23/19 1646 0   Constitutional: Alert and oriented.  Eyes: Conjunctivae are normal.  ENT      Head: Normocephalic and atraumatic.      Nose: No congestion/rhinnorhea.      Mouth/Throat: Mucous membranes are moist.  Neck: No stridor. Hematological/Lymphatic/Immunilogical: No cervical lymphadenopathy. Cardiovascular: Normal rate, regular rhythm.  No murmurs, rubs, or gallops.  Respiratory: Normal respiratory effort without tachypnea nor retractions. Breath sounds are clear and equal bilaterally. No wheezes/rales/rhonchi. Gastrointestinal: Soft and non tender. No rebound. No guarding.  Genitourinary: Deferred Musculoskeletal: Normal range of motion in all extremities. No lower extremity edema. Neurologic:  Normal speech and language. No gross focal neurologic deficits are appreciated.  Skin:  Skin  is warm, dry and intact. No rash noted. Psychiatric: Mood and affect are normal. Speech and behavior are normal. Patient exhibits appropriate insight and judgment.  ____________________________________________    LABS (pertinent positives/negatives)  Upreg positive A POS UA clear, moderate hgb dipstick, 20 ketones, 0-5 rbc and wbc Lipase 25 CBC wbc 11.0, hgb 13.2, plt 271 CMP wnl except co2 19  ____________________________________________   EKG  None  ____________________________________________    RADIOLOGY  US OB Single intrauterine gestational and yolk sac  ____________________________________________   PROCEDURES  Procedures  ____________________________________________   INITIAL IMPRESSION / ASSESSMENT AND PLAN / ED COURSE  Pertinent labs & imaging results that were available during my care of the patient were reviewed by me and considered in my medical decision making (see chart for details).   Patient presented to the emergency department today because of concerns for vaginal bleeding in the setting of early pregnancy.  Patient blood type a positive.  Ultrasound was performed which shows an intrauterine gestational and yolk sac.  I discussed this finding with the patient.  Also discussed with patient importance of follow-up up with OB/GYN and starting prenatal vitamins.  ___________________________________________   FINAL CLINICAL IMPRESSION(S) / ED DIAGNOSES  Final diagnoses:  Bleeding in early pregnancy     Note: This dictation was prepared with Dragon dictation. Any transcriptional errors that result from this process are unintentional     Phineas Semen, MD 12/23/19 1931

## 2019-12-23 NOTE — ED Triage Notes (Signed)
Patient reports having vaginal bleeding with a large clot today. Last period May 8th. Positive pregnancy test at home

## 2019-12-23 NOTE — ED Notes (Signed)
Pt transported to [facility]

## 2019-12-26 ENCOUNTER — Encounter: Payer: Self-pay | Admitting: Certified Nurse Midwife

## 2019-12-26 ENCOUNTER — Other Ambulatory Visit: Payer: Self-pay

## 2019-12-26 ENCOUNTER — Ambulatory Visit (INDEPENDENT_AMBULATORY_CARE_PROVIDER_SITE_OTHER): Payer: BC Managed Care – PPO | Admitting: Certified Nurse Midwife

## 2019-12-26 VITALS — BP 121/89 | HR 82 | Ht 66.0 in | Wt 168.2 lb

## 2019-12-26 DIAGNOSIS — N926 Irregular menstruation, unspecified: Secondary | ICD-10-CM

## 2019-12-26 LAB — POCT URINE PREGNANCY: Preg Test, Ur: POSITIVE — AB

## 2019-12-26 NOTE — Patient Instructions (Signed)

## 2019-12-26 NOTE — Progress Notes (Signed)
Subjective:    Martha Finley is a 24 y.o. female who presents for evaluation of amenorrhea. She believes she could be pregnant. Pregnancy is desired. Sexual Activity: single partner, contraception: none. Current symptoms also include: breast tenderness. Last period was normal. She experienced some vaginal bleeding and cramping and went to ED on 7/4.   Patient's last menstrual period was 10/28/2019 (exact date). The following portions of the patient's history were reviewed and updated as appropriate: allergies, current medications, past family history, past medical history, past social history, past surgical history and problem list.  Review of Systems Pertinent items are noted in HPI.     Objective:    BP 121/89   Pulse 82   Ht 5\' 6"  (1.676 m)   Wt 168 lb 4 oz (76.3 kg)   LMP 10/28/2019 (Exact Date)   BMI 27.16 kg/m  General: alert, cooperative, appears stated age and no acute distress    CLINICAL DATA:  Vaginal bleeding.  EXAM: OBSTETRIC <14 WK 12/28/2019 AND TRANSVAGINAL OB US  TECHNIQUE: Both transabdominal and transvaginal ultrasound examinations were performed for complete evaluation of the gestation as well as the maternal uterus, adnexal regions, and pelvic cul-de-sac. Transvaginal technique was performed to assess early pregnancy.  COMPARISON:  None.  FINDINGS: Intrauterine gestational sac: Single  Yolk sac:  Visualized.  Embryo:  Not Visualized.  Cardiac Activity: Not Visualized.  Heart Rate: N/A  bpm  MSD: 7.5 mm   5 w   4 d  Subchorionic hemorrhage:  None visualized.  Maternal uterus/adnexae:  The right ovary measures 4.7 cm x 2.5 cm x 2.1 cm and is normal in appearance.  The left ovary measures 2.8 cm x 2.3 cm x 1.8 cm and is normal in appearance.  No free fluid is identified.  IMPRESSION: Single intrauterine gestational sac and yolk sac, at approximately 5 weeks and 4 days gestation by ultrasound evaluation, without visualization of a  fetal pole. This may be secondary to early intrauterine pregnancy. Correlation with follow-up pelvic ultrasound and serial beta HCG levels is recommended.   Electronically Signed   By: [place] M.D.   On: 12/23/2019 19:17   Lab Review Urine HCG: positive    Assessment:    Absence of menstruation.     Plan:   Positive: EDC: .08/21/20 Briefly discussed pre-natal care options. Md or Midwifery care.  Encouraged well-balanced diet, plenty of rest when needed, pre-natal vitamins daily and walking for exercise. Discussed self-help for nausea, avoiding OTC medications until consulting provider or pharmacist, other than Tylenol as needed, minimal caffeine (1-2 cups daily) and avoiding alcohol. She will schedule her initial nurse visit in 4 weeks and her NOB visit in 6 wks.  Feel free to call with any questions.  10/21/20, CNM

## 2020-01-25 ENCOUNTER — Ambulatory Visit (INDEPENDENT_AMBULATORY_CARE_PROVIDER_SITE_OTHER): Payer: BC Managed Care – PPO | Admitting: Surgical

## 2020-01-25 VITALS — BP 129/85 | HR 92 | Ht 66.0 in | Wt 176.7 lb

## 2020-01-25 DIAGNOSIS — Z3491 Encounter for supervision of normal pregnancy, unspecified, first trimester: Secondary | ICD-10-CM

## 2020-01-25 DIAGNOSIS — Z3A10 10 weeks gestation of pregnancy: Secondary | ICD-10-CM | POA: Diagnosis not present

## 2020-01-25 NOTE — Progress Notes (Signed)
Martha Finley presents for NOB nurse interview visit. Pregnancy confirmation done 12/26/2019. G1. P0. Pregnancy education material explained and given. 1 cats in home. NOB labs ordered. HIV labs and drug screen were explained and ordered. PNV encouraged. Genetic screening options discussed. Genetic testing:Unsure. Patient may discuss with the provider. Patient to follow up with provider on 02/05/2020 weeks for NOB physical. All questions answered.

## 2020-01-25 NOTE — Patient Instructions (Signed)
First Trimester of Pregnancy  The first trimester of pregnancy is from week 1 until the end of week 13 (months 1 through 3). During this time, your baby will begin to develop inside you. At 6-8 weeks, the eyes and face are formed, and the heartbeat can be seen on ultrasound. At the end of 12 weeks, all the baby's organs are formed. Prenatal care is all the medical care you receive before the birth of your baby. Make sure you get good prenatal care and follow all of your doctor's instructions. Follow these instructions at home: Medicines  Take over-the-counter and prescription medicines only as told by your doctor. Some medicines are safe and some medicines are not safe during pregnancy.  Take a prenatal vitamin that contains at least 600 micrograms (mcg) of folic acid.  If you have trouble pooping (constipation), take medicine that will make your stool soft (stool softener) if your doctor approves. Eating and drinking   Eat regular, healthy meals.  Your doctor will tell you the amount of weight gain that is right for you.  Avoid raw meat and uncooked cheese.  If you feel sick to your stomach (nauseous) or throw up (vomit): ? Eat 4 or 5 small meals a day instead of 3 large meals. ? Try eating a few soda crackers. ? Drink liquids between meals instead of during meals.  To prevent constipation: ? Eat foods that are high in fiber, like fresh fruits and vegetables, whole grains, and beans. ? Drink enough fluids to keep your pee (urine) clear or pale yellow. Activity  Exercise only as told by your doctor. Stop exercising if you have cramps or pain in your lower belly (abdomen) or low back.  Do not exercise if it is too hot, too humid, or if you are in a place of great height (high altitude).  Try to avoid standing for long periods of time. Move your legs often if you must stand in one place for a long time.  Avoid heavy lifting.  Wear low-heeled shoes. Sit and stand up  straight.  You can have sex unless your doctor tells you not to. Relieving pain and discomfort  Wear a good support bra if your breasts are sore.  Take warm water baths (sitz baths) to soothe pain or discomfort caused by hemorrhoids. Use hemorrhoid cream if your doctor says it is okay.  Rest with your legs raised if you have leg cramps or low back pain.  If you have puffy, bulging veins (varicose veins) in your legs: ? Wear support hose or compression stockings as told by your doctor. ? Raise (elevate) your feet for 15 minutes, 3-4 times a day. ? Limit salt in your food. Prenatal care  Schedule your prenatal visits by the twelfth week of pregnancy.  Write down your questions. Take them to your prenatal visits.  Keep all your prenatal visits as told by your doctor. This is important. Safety  Wear your seat belt at all times when driving.  Make a list of emergency phone numbers. The list should include numbers for family, friends, the hospital, and police and fire departments. General instructions  Ask your doctor for a referral to a local prenatal class. Begin classes no later than at the start of month 6 of your pregnancy.  Ask for help if you need counseling or if you need help with nutrition. Your doctor can give you advice or tell you where to go for help.  Do not use hot tubs, steam   rooms, or saunas.  Do not douche or use tampons or scented sanitary pads.  Do not cross your legs for long periods of time.  Avoid all herbs and alcohol. Avoid drugs that are not approved by your doctor.  Do not use any tobacco products, including cigarettes, chewing tobacco, and electronic cigarettes. If you need help quitting, ask your doctor. You may get counseling or other support to help you quit.  Avoid cat litter boxes and soil used by cats. These carry germs that can cause birth defects in the baby and can cause a loss of your baby (miscarriage) or stillbirth.  Visit your dentist.  At home, brush your teeth with a soft toothbrush. Be gentle when you floss. Contact a doctor if:  You are dizzy.  You have mild cramps or pressure in your lower belly.  You have a nagging pain in your belly area.  You continue to feel sick to your stomach, you throw up, or you have watery poop (diarrhea).  You have a bad smelling fluid coming from your vagina.  You have pain when you pee (urinate).  You have increased puffiness (swelling) in your face, hands, legs, or ankles. Get help right away if:  You have a fever.  You are leaking fluid from your vagina.  You have spotting or bleeding from your vagina.  You have very bad belly cramping or pain.  You gain or lose weight rapidly.  You throw up blood. It may look like coffee grounds.  You are around people who have German measles, fifth disease, or chickenpox.  You have a very bad headache.  You have shortness of breath.  You have any kind of trauma, such as from a fall or a car accident. Summary  The first trimester of pregnancy is from week 1 until the end of week 13 (months 1 through 3).  To take care of yourself and your unborn baby, you will need to eat healthy meals, take medicines only if your doctor tells you to do so, and do activities that are safe for you and your baby.  Keep all follow-up visits as told by your doctor. This is important as your doctor will have to ensure that your baby is healthy and growing well. This information is not intended to replace advice given to you by your health care provider. Make sure you discuss any questions you have with your health care provider. Document Revised: 09/29/2018 Document Reviewed: 06/16/2016 Elsevier Patient Education  2020 [employer]   Common Medications Safe in Pregnancy  Acne:      Constipation:  Benzoyl Peroxide     Colace  Clindamycin      Dulcolax Suppository  Topica Erythromycin     Fibercon  Salicylic  Acid      Metamucil         Miralax AVOID:        Senakot   Accutane    Cough:  Retin-A       Cough Drops  Tetracycline      Phenergan w/ Codeine if Rx  Minocycline      Robitussin (Plain & DM)  Antibiotics:     Crabs/Lice:  Ceclor       RID  Cephalosporins    AVOID:  E-Mycins      Kwell  Keflex  Macrobid/Macrodantin   Diarrhea:  Penicillin      Kao-Pectate  Zithromax      Imodium AD         PUSH   FLUIDS AVOID:       Cipro     Fever:  Tetracycline      Tylenol (Regular or Extra  Minocycline       Strength)  Levaquin      Extra Strength-Do not          Exceed 8 tabs/24 hrs Caffeine:        <200mg/day (equiv. To 1 cup of coffee or  approx. 3 12 oz sodas)         Gas: Cold/Hayfever:       Gas-X  Benadryl      Mylicon  Claritin       Phazyme  **Claritin-D        Chlor-Trimeton    Headaches:  Dimetapp      ASA-Free Excedrin  Drixoral-Non-Drowsy     Cold Compress  Mucinex (Guaifenasin)     Tylenol (Regular or Extra  Sudafed/Sudafed-12 Hour     Strength)  **Sudafed PE Pseudoephedrine   Tylenol Cold & Sinus     Vicks Vapor Rub  Zyrtec  **AVOID if Problems With Blood Pressure         Heartburn: Avoid lying down for at least 1 hour after meals  Aciphex      Maalox     Rash:  Milk of Magnesia     Benadryl    Mylanta       1% Hydrocortisone Cream  Pepcid  Pepcid Complete   Sleep Aids:  Prevacid      Ambien   Prilosec       Benadryl  Rolaids       Chamomile Tea  Tums (Limit 4/day)     Unisom         Tylenol PM         Warm milk-add vanilla or  Hemorrhoids:       Sugar for taste  Anusol/Anusol H.C.  (RX: Analapram 2.5%)  Sugar Substitutes:  Hydrocortisone OTC     Ok in moderation  Preparation H      Tucks        Vaseline lotion applied to tissue with wiping    Herpes:     Throat:  Acyclovir      Oragel  Famvir  Valtrex     Vaccines:         Flu Shot Leg Cramps:       *Gardasil  Benadryl      Hepatitis A         Hepatitis B Nasal  Spray:       Pneumovax  Saline Nasal Spray     Polio Booster         Tetanus Nausea:       Tuberculosis test or PPD  Vitamin B6 25 mg TID   AVOID:    Dramamine      *Gardasil  Emetrol       Live Poliovirus  Ginger Root 250 mg QID    MMR (measles, mumps &  High Complex Carbs @ Bedtime    rebella)  Sea Bands-Accupressure    Varicella (Chickenpox)  Unisom 1/2 tab TID     *No known complications           If received before Pain:         Known pregnancy;   Darvocet       Resume series after  Lortab        Delivery  Percocet    Yeast:   Tramadol        Femstat  Tylenol 3      Gyne-lotrimin  Ultram       Monistat  Vicodin           MISC:         All Sunscreens           Hair Coloring/highlights          Insect Repellant's          (Including DEET)         Mystic Tans  

## 2020-01-26 LAB — ABO AND RH: Rh Factor: POSITIVE

## 2020-01-26 LAB — URINALYSIS, ROUTINE W REFLEX MICROSCOPIC
Bilirubin, UA: NEGATIVE
Glucose, UA: NEGATIVE
Leukocytes,UA: NEGATIVE
Nitrite, UA: NEGATIVE
RBC, UA: NEGATIVE
Specific Gravity, UA: >=1.03 — AB (ref 1.005–1.030)
Urobilinogen, Ur: 0.2 mg/dL (ref 0.2–1.0)
pH, UA: 5.5 (ref 5.0–7.5)

## 2020-01-26 LAB — GC/CHLAMYDIA PROBE AMP
Chlamydia trachomatis, NAA: NEGATIVE
Neisseria Gonorrhoeae by PCR: NEGATIVE

## 2020-01-26 LAB — RPR: RPR Ser Ql: NONREACTIVE

## 2020-01-26 LAB — HIV ANTIBODY (ROUTINE TESTING W REFLEX): HIV Screen 4th Generation wRfx: NONREACTIVE

## 2020-01-26 LAB — VARICELLA ZOSTER ANTIBODY, IGG: Varicella zoster IgG: 1809 index (ref >165)

## 2020-01-26 LAB — RUBELLA SCREEN: Rubella Antibodies, IGG: 1.79 index (ref >0.99)

## 2020-01-26 LAB — HEPATITIS B SURFACE ANTIGEN: Hepatitis B Surface Ag: NEGATIVE

## 2020-01-26 LAB — ANTIBODY SCREEN: Antibody Screen: NEGATIVE

## 2020-01-27 LAB — CULTURE, OB URINE

## 2020-01-27 LAB — URINE CULTURE, OB REFLEX

## 2020-02-03 LAB — MONITOR DRUG PROFILE 14(MW)
Amphetamine Scrn, Ur: NEGATIVE ng/mL
BARBITURATE SCREEN URINE: NEGATIVE ng/mL
BENZODIAZEPINE SCREEN, URINE: NEGATIVE ng/mL
Buprenorphine, Urine: NEGATIVE ng/mL
Cocaine (Metab) Scrn, Ur: NEGATIVE ng/mL
Creatinine(Crt), U: 182.9 mg/dL (ref 20.0–300.0)
Fentanyl, Urine: NEGATIVE pg/mL
Meperidine Screen, Urine: NEGATIVE ng/mL
Methadone Screen, Urine: NEGATIVE ng/mL
OXYCODONE+OXYMORPHONE UR QL SCN: NEGATIVE ng/mL
Opiate Scrn, Ur: NEGATIVE ng/mL
Ph of Urine: 5.4 (ref 4.5–8.9)
Phencyclidine Qn, Ur: NEGATIVE ng/mL
Propoxyphene Scrn, Ur: NEGATIVE ng/mL
SPECIFIC GRAVITY: 1.026
Tramadol Screen, Urine: NEGATIVE ng/mL

## 2020-02-03 LAB — NICOTINE SCREEN, URINE: Cotinine Ql Scrn, Ur: NEGATIVE ng/mL

## 2020-02-03 LAB — CANNABINOID (GC/MS), URINE
Cannabinoid: POSITIVE — AB
Carboxy THC (GC/MS): 147 ng/mL

## 2020-02-05 ENCOUNTER — Encounter: Payer: Self-pay | Admitting: Certified Nurse Midwife

## 2020-02-05 ENCOUNTER — Other Ambulatory Visit: Payer: Self-pay

## 2020-02-05 ENCOUNTER — Ambulatory Visit (INDEPENDENT_AMBULATORY_CARE_PROVIDER_SITE_OTHER): Payer: BC Managed Care – PPO | Admitting: Certified Nurse Midwife

## 2020-02-05 VITALS — BP 113/73 | HR 79 | Wt 179.0 lb

## 2020-02-05 DIAGNOSIS — Z3A11 11 weeks gestation of pregnancy: Secondary | ICD-10-CM

## 2020-02-05 LAB — POCT URINALYSIS DIPSTICK OB
Bilirubin, UA: NEGATIVE
Blood, UA: NEGATIVE
Glucose, UA: NEGATIVE
Ketones, UA: NEGATIVE
Leukocytes, UA: NEGATIVE
Nitrite, UA: NEGATIVE
POC,PROTEIN,UA: NEGATIVE
Spec Grav, UA: 1.02 (ref 1.010–1.025)
Urobilinogen, UA: 0.2 E.U./dL
pH, UA: 5 (ref 5.0–8.0)

## 2020-02-05 MED ORDER — TETANUS-DIPHTH-ACELL PERTUSSIS 5-2.5-18.5 LF-MCG/0.5 IM SUSP: 0.5000 mL | Freq: Once | INTRAMUSCULAR | Status: DC

## 2020-02-05 NOTE — Patient Instructions (Signed)

## 2020-02-05 NOTE — Progress Notes (Signed)
NEW OB HISTORY AND PHYSICAL  SUBJECTIVE:       Martha Finley is a 24 y.o. G2P0000 female, Patient's last menstrual period was 10/28/2019 (exact date)., Estimated Date of Delivery: 08/20/20, [redacted]w[redacted]d, presents today for establishment of Prenatal Care. She has no unusual complaints.     Gynecologic History Patient's last menstrual period was 10/28/2019 (exact date). Normal Contraception: none Last Pap:07/12/18 Results were: normal  Obstetric History OB History  Gravida Para Term Preterm AB Living  1 0 0 0 0 0  SAB TAB Ectopic Multiple Live Births  0 0 0 0 0    # Outcome Date GA Lbr Len/2nd Weight Sex Delivery Anes PTL Lv  1 Current             Past Medical History:  Diagnosis Date  . Known health problems: none     Past Surgical History:  Procedure Laterality Date  . NO PAST SURGERIES    . WISDOM TOOTH EXTRACTION      Current Outpatient Medications on File Prior to Visit  Medication Sig Dispense Refill  . folic acid (FOLVITE) 1 MG tablet Take 1 mg by mouth daily.    . Prenatal Vit-Fe Fumarate-FA (PRENATAL MULTIVITAMIN) TABS tablet Take 1 tablet by mouth daily at 12 noon.     No current facility-administered medications on file prior to visit.    No Known Allergies  Social History   Socioeconomic History  . Marital status: Married    Spouse name: Not on file  . Number of children: Not on file  . Years of education: Not on file  . Highest education level: Not on file  Occupational History  . Not on file  Tobacco Use  . Smoking status: Former Smoker    Types: Cigarettes, E-cigarettes  . Smokeless tobacco: Never Used  Vaping Use  . Vaping Use: Every day  Substance and Sexual Activity  . Alcohol use: Not Currently    Alcohol/week: 0.0 standard drinks    Comment: rare  . Drug use: Not Currently    Types: Marijuana  . Sexual activity: Yes    Birth control/protection: None  Other Topics Concern  . Not on file  Social History Narrative  . Not on file   Social  Determinants of Health   Financial Resource Strain:   . Difficulty of Paying Living Expenses:   Food Insecurity:   . Worried About Programme researcher, broadcasting/film/video in the Last Year:   . Barista in the Last Year:   Transportation Needs:   . Freight forwarder (Medical):   Marland Kitchen Lack of Transportation (Non-Medical):   Physical Activity:   . Days of Exercise per Week:   . Minutes of Exercise per Session:   Stress:   . Feeling of Stress :   Social Connections:   . Frequency of Communication with Friends and Family:   . Frequency of Social Gatherings with Friends and Family:   . Attends Religious Services:   . Active Member of Clubs or Organizations:   . Attends Banker Meetings:   Marland Kitchen Marital Status:   Intimate Partner Violence:   . Fear of Current or Ex-Partner:   . Emotionally Abused:   Marland Kitchen Physically Abused:   . Sexually Abused:     Family History  Problem Relation Age of Onset  . Diabetes Maternal Grandmother   . COPD Maternal Grandmother   . Breast cancer Maternal Grandmother     The following portions of the patient's history  were reviewed and updated as appropriate: allergies, current medications, past OB history, past medical history, past surgical history, past family history, past social history, and problem list.    OBJECTIVE: Initial Physical Exam (New OB)  GENERAL APPEARANCE: alert, well appearing, in no apparent distress, well hydrated HEAD: normocephalic, atraumatic MOUTH: mucous membranes moist, pharynx normal without lesions THYROID: no thyromegaly or masses present BREASTS: no masses noted, no significant tenderness, no palpable axillary nodes, no skin changes LUNGS: clear to auscultation, no wheezes, rales or rhonchi, symmetric air entry HEART: regular rate and rhythm, no murmurs ABDOMEN: soft, nontender, nondistended, no abnormal masses, no epigastric pain, fundus soft, nontender 12 weeks size and FHT present EXTREMITIES: no redness or tenderness  in the calves or thighs, no edema, no limitation in range of motion, intact peripheral pulses SKIN: normal coloration and turgor, no rashes LYMPH NODES: no adenopathy palpable NEUROLOGIC: alert, oriented, normal speech, no focal findings or movement disorder noted  PELVIC EXAM EXTERNAL GENITALIA: normal appearing vulva with no masses, tenderness or lesions VAGINA: no abnormal discharge or lesions CERVIX: no lesions or cervical motion tenderness UTERUS: gravid ADNEXA: no masses palpable and nontender OB EXAM PELVIMETRY: appears adequate RECTUM: exam not indicated  ASSESSMENT: Normal pregnancy  PLAN: Prenatal care See ordersNew OB counseling: The patient has been given an overview regarding routine prenatal care. Recommendations regarding diet, weight gain, and exercise in pregnancy were given. Prenatal testing, optional genetic testing, carrier screening , and ultrasound use in pregnancy were reviewed. Panorama testing today. Benefits of Breast Feeding were discussed. The patient is encouraged to consider nursing her baby post partum.Follow up 4 wks with Marcelino Duster.   Doreene Burke, CNM

## 2020-02-06 LAB — CBC
Hematocrit: 36.5 % (ref 34.0–46.6)
Hemoglobin: 11.8 g/dL (ref 11.1–15.9)
MCH: 28.9 pg (ref 26.6–33.0)
MCHC: 32.3 g/dL (ref 31.5–35.7)
MCV: 90 fL (ref 79–97)
Platelets: 296 10*3/uL (ref 150–450)
RBC: 4.08 x10E6/uL (ref 3.77–5.28)
RDW: 12.4 % (ref 11.7–15.4)
WBC: 11.3 10*3/uL — ABNORMAL HIGH (ref 3.4–10.8)

## 2020-02-14 ENCOUNTER — Telehealth: Payer: Self-pay

## 2020-02-14 NOTE — Telephone Encounter (Signed)
mychart message sent to patient

## 2020-02-15 ENCOUNTER — Telehealth: Payer: Self-pay

## 2020-02-15 NOTE — Telephone Encounter (Signed)
mychart message sent to patient

## 2020-02-15 NOTE — Telephone Encounter (Signed)
mychart message sent to patient with gender results per her request

## 2020-03-04 ENCOUNTER — Ambulatory Visit (INDEPENDENT_AMBULATORY_CARE_PROVIDER_SITE_OTHER): Payer: BC Managed Care – PPO | Admitting: Certified Nurse Midwife

## 2020-03-04 ENCOUNTER — Encounter: Payer: Self-pay | Admitting: Certified Nurse Midwife

## 2020-03-04 ENCOUNTER — Other Ambulatory Visit: Payer: Self-pay

## 2020-03-04 VITALS — BP 111/76 | HR 78 | Wt 183.4 lb

## 2020-03-04 DIAGNOSIS — Z23 Encounter for immunization: Secondary | ICD-10-CM

## 2020-03-04 DIAGNOSIS — Z3402 Encounter for supervision of normal first pregnancy, second trimester: Secondary | ICD-10-CM

## 2020-03-04 DIAGNOSIS — Z3689 Encounter for other specified antenatal screening: Secondary | ICD-10-CM

## 2020-03-04 DIAGNOSIS — Z3A15 15 weeks gestation of pregnancy: Secondary | ICD-10-CM

## 2020-03-04 LAB — POCT URINALYSIS DIPSTICK OB
Bilirubin, UA: NEGATIVE
Blood, UA: NEGATIVE
Glucose, UA: NEGATIVE
Ketones, UA: NEGATIVE
Leukocytes, UA: NEGATIVE
Nitrite, UA: NEGATIVE
Spec Grav, UA: 1.015 (ref 1.010–1.025)
Urobilinogen, UA: 0.2 E.U./dL
pH, UA: 7 (ref 5.0–8.0)

## 2020-03-04 NOTE — Patient Instructions (Signed)
Healthy Weight Gain During Pregnancy, Adult A certain amount of weight gain during pregnancy is normal and healthy. How much weight you should gain depends on your overall health and a measurement called BMI (body mass index). BMI is an estimate of your body fat based on your height and weight. You can use an Freight forwarder to figure out your BMI, or you can ask your health care provider to calculate it for you at your next visit. Your recommended pregnancy weight gain is based on your pre-pregnancy BMI. General guidelines for a healthy total weight gain during pregnancy are listed below. If your BMI at or before the start of your pregnancy is:  Less than 18.5 (underweight), you should gain 28-40 lb (13-18 kg).  18.5-24.9 (normal weight), you should gain 25-35 lb (11-16 kg).  25-29.9 (overweight), you should gain 15-25 lb (7-11 kg).  30 or higher (obese), you should gain 11-20 lb (5-9 kg). These ranges vary depending on your individual health. If you are carrying more than one baby (multiples), it may be safe to gain more weight than these recommendations. If you gain less weight than recommended, that may be safe as long as your baby is growing and developing normally. How can unhealthy weight gain affect me and my baby? Gaining too much weight during pregnancy can lead to pregnancy complications, such as:  A temporary form of diabetes that develops during pregnancy (gestational diabetes).  High blood pressure during pregnancy and protein in your urine (preeclampsia).  High blood pressure during pregnancy without protein in your urine (gestational hypertension).  Your baby having a high weight at birth, which may: ? Raise your risk of having a more difficult delivery or a surgical delivery (cesarean delivery, or C-section). ? Raise your child's risk of developing obesity during childhood. Not gaining enough weight can be life-threatening for your baby, and it may raise your baby's chances  of:  Being born early (preterm).  Growing more slowly than normal during pregnancy (growth restriction).  Having a low weight at birth. What actions can I take to gain a healthy amount of weight during pregnancy? General instructions  Keep track of your weight gain during pregnancy.  Take over-the-counter and prescription medicines only as told by your health care provider. Take all prenatal supplements as directed.  Keep all health care visits during pregnancy (prenatal visits). These visits are a good time to discuss your weight gain. Your health care provider will weigh you at each visit to make sure you are gaining a healthy amount of weight. Nutrition   Eat a balanced, nutrient-rich diet. Eat plenty of: ? Fruits and vegetables, such as berries and broccoli. ? Whole grains, such as millet, barley, whole-wheat breads and cereals, and oatmeal. ? Low-fat dairy products or non-dairy products such as almond milk or rice milk. ? Protein foods, such as lean meat, chicken, eggs, and legumes (such as peas, beans, soybeans, and lentils).  Avoid foods that are fried or have a lot of fat, salt (sodium), or sugar.  Drink enough fluid to keep your urine pale yellow.  Choose healthy snack and drink options when you are at work or on the go: ? Drink water. Avoid soda, sports drinks, and juices that have added sugar. ? Avoid drinks with caffeine, such as coffee and energy drinks. ? Eat snacks that are high in protein, such as nuts, protein bars, and low-fat yogurt. ? Carry convenient snacks in your purse that do not need refrigeration, such as a pack of  trail mix, an apple, or a granola bar.  If you need help improving your diet, work with a health care provider or a diet and nutrition specialist (dietitian). Activity   Exercise regularly, as told by your health care provider. ? If you were active before becoming pregnant, you may be able to continue your regular fitness activities. ? If  you were not active before pregnancy, you may gradually build up to exercising for 30 or more minutes on most days of the week. This may include walking, swimming, or yoga.  Ask your health care provider what activities are safe for you. Talk with your health care provider about whether you may need to be excused from certain school or work activities. Where to find more information Learn more about managing your weight gain during pregnancy from:  American Pregnancy Association: www.americanpregnancy.org  [place] Department of Agriculture pregnancy weight gain calculator: Former[employer] Summary  Too much weight gain during pregnancy can lead to complications for you and your baby.  Find out your pre-pregnancy BMI to determine how much weight gain is healthy for you.  Eat nutritious foods and stay active.  Keep all of your prenatal visits as told by your health care provider. This information is not intended to replace advice given to you by your health care provider. Make sure you discuss any questions you have with your health care provider. Document Revised: 03/01/2019 Document Reviewed: 02/26/2017 Elsevier Patient Education  [place].    Exercise During Pregnancy Exercise is an important part of being healthy for people of all ages. Exercise improves the function of your heart and lungs and helps you maintain strength, flexibility, and a healthy body weight. Exercise also boosts energy levels and elevates mood. Most women should exercise regularly during pregnancy. In rare cases, women with certain medical conditions or complications may be asked to limit or avoid exercise during pregnancy. How does this affect me? Along with maintaining general strength and flexibility, exercising during pregnancy can help:  Keep strength in muscles that are used during labor and childbirth.  Decrease low back pain.  Reduce symptoms of depression.  Control weight gain during  pregnancy.  Reduce the risk of needing insulin if you develop diabetes during pregnancy.  Decrease the risk of cesarean delivery.  Speed up your recovery after giving birth. How does this affect my baby? Exercise can help you have a healthy pregnancy. Exercise does not cause premature birth. It will not cause your baby to weigh less at birth. What exercises can I do? Many exercises are safe for you to do during pregnancy. Do a variety of exercises that safely increase your heart and breathing rates and help you build and maintain muscle strength. Do exercises exactly as told by your health care provider. You may do these exercises:  Walking or hiking.  Swimming.  Water aerobics.  Riding a stationary bike.  Strength training.  Modified yoga or Pilates. Tell your instructor that you are pregnant. Avoid overstretching, and avoid lying on your back for long periods of time.  Running or jogging. Only choose this type of exercise if you: ? Ran or jogged regularly before your pregnancy. ? Can run or jog and still talk in complete sentences. What exercises should I avoid? Depending on your level of fitness and whether you exercised regularly before your pregnancy, you may be told to limit high-intensity exercise. You can tell that you are exercising at a high intensity if you are breathing much harder and faster  and cannot hold a conversation while exercising. You must avoid:  Contact sports.  Activities that put you at risk for falling on or being hit in the belly, such as downhill skiing, water skiing, surfing, rock climbing, cycling, gymnastics, and horseback riding.  Scuba diving.  Skydiving.  Yoga or Pilates in a room that is heated to high temperatures.  Jogging or running, unless you ran or jogged regularly before your pregnancy. While jogging or running, you should always be able to talk in full sentences. Do not run or jog so fast that you are unable to have a  conversation.  Do not exercise at more than 6,000 feet above sea level (high elevation) if you are not used to exercising at high elevation. How do I exercise in a safe way?   Avoid overheating. Do not exercise in very high temperatures.  Wear loose-fitting, breathable clothes.  Avoid dehydration. Drink enough water before, during, and after exercise to keep your urine pale yellow.  Avoid overstretching. Because of hormone changes during pregnancy, it is easy to overstretch muscles, tendons, and ligaments during pregnancy.  Start slowly and ask your health care provider to recommend the types of exercise that are safe for you.  Do not exercise to lose weight. Follow these instructions at home:  Exercise on most days or all days of the week. Try to exercise for 30 minutes a day, 5 days a week, unless your health care provider tells you not to.  If you actively exercised before your pregnancy and you are healthy, your health care provider may tell you to continue to do moderate to high-intensity exercise.  If you are just starting to exercise or did not exercise much before your pregnancy, your health care provider may tell you to do low to moderate-intensity exercise. Questions to ask your health care provider  Is exercise safe for me?  What are signs that I should stop exercising?  Does my health condition mean that I should not exercise during pregnancy?  When should I avoid exercising during pregnancy? Stop exercising and contact a health care provider if: You have any unusual symptoms, such as:  Mild contractions of the uterus or cramps in the abdomen.  Dizziness that does not go away when you rest. Stop exercising and get help right away if: You have any unusual symptoms, such as:  Sudden, severe pain in your low back or your belly.  Mild contractions of the uterus or cramps in the abdomen that do not improve with rest and drinking fluids.  Chest pain.  Bleeding or  fluid leaking from your vagina.  Shortness of breath. These symptoms may represent a serious problem that is an emergency. Do not wait to see if the symptoms will go away. Get medical help right away. Call your local emergency services (911 in the [place] Do not drive yourself to the hospital. Summary  Most women should exercise regularly throughout pregnancy. In rare cases, women with certain medical conditions or complications may be asked to limit or avoid exercise during pregnancy.  Do not exercise to lose weight during pregnancy.  Your health care provider will tell you what level of physical activity is right for you.  Stop exercising and contact a health care provider if you have mild contractions of the uterus or cramps in the abdomen. Get help right away if these contractions or cramps do not improve with rest and drinking fluids.  Stop exercising and get help right away if you have sudden,  severe pain in your low back or belly, chest pain, shortness of breath, or bleeding or leaking of fluid from your vagina. This information is not intended to replace advice given to you by your health care provider. Make sure you discuss any questions you have with your health care provider. Document Revised: 09/29/2018 Document Reviewed: 07/13/2018 Elsevier Patient Education  [place].    Common Medications Safe in Pregnancy  Acne:      Constipation:  Benzoyl Peroxide     Colace  Clindamycin      Dulcolax Suppository  Topica Erythromycin     Fibercon  Salicylic Acid      Metamucil         Miralax AVOID:        Senakot   Accutane    Cough:  Retin-A       Cough Drops  Tetracycline      Phenergan w/ Codeine if Rx  Minocycline      Robitussin (Plain & DM)  Antibiotics:     Crabs/Lice:  Ceclor       RID  Cephalosporins    AVOID:  E-Mycins      Kwell  Keflex  Macrobid/Macrodantin   Diarrhea:  Penicillin      Kao-Pectate  Zithromax      Imodium AD         PUSH  FLUIDS AVOID:       Cipro     Fever:  Tetracycline      Tylenol (Regular or Extra  Minocycline       Strength)  Levaquin      Extra Strength-Do not          Exceed 8 tabs/24 hrs Caffeine:        <239m/day (equiv. To 1 cup of coffee or  approx. 3 12 oz sodas)         Gas: Cold/Hayfever:       Gas-X  Benadryl      Mylicon  Claritin       Phazyme  **Claritin-D        Chlor-Trimeton    Headaches:  Dimetapp      ASA-Free Excedrin  Drixoral-Non-Drowsy     Cold Compress  Mucinex (Guaifenasin)     Tylenol (Regular or Extra  Sudafed/Sudafed-12 Hour     Strength)  **Sudafed PE Pseudoephedrine   Tylenol Cold & Sinus     Vicks Vapor Rub  Zyrtec  **AVOID if Problems With Blood Pressure         Heartburn: Avoid lying down for at least 1 hour after meals  Aciphex      Maalox     Rash:  Milk of Magnesia     Benadryl    Mylanta       1% Hydrocortisone Cream  Pepcid  Pepcid Complete   Sleep Aids:  Prevacid      Ambien   Prilosec       Benadryl  Rolaids       Chamomile Tea  Tums (Limit 4/day)     Unisom         Tylenol PM         Warm milk-add vanilla or  Hemorrhoids:       Sugar for taste  Anusol/Anusol H.C.  (RX: Analapram 2.5%)  Sugar Substitutes:  Hydrocortisone OTC     Ok in moderation  Preparation H      Tucks        Vaseline lotion applied to tissue with  wiping    Herpes:     Throat:  Acyclovir      Oragel  Famvir  Valtrex     Vaccines:         Flu Shot Leg Cramps:       *Gardasil  Benadryl      Hepatitis A         Hepatitis B Nasal Spray:       Pneumovax  Saline Nasal Spray     Polio Booster         Tetanus Nausea:       Tuberculosis test or PPD  Vitamin B6 25 mg TID   AVOID:    Dramamine      *Gardasil  Emetrol       Live Poliovirus  Ginger Root 250 mg QID    MMR (measles, mumps &  High Complex Carbs @ Bedtime    rebella)  Sea Bands-Accupressure    Varicella (Chickenpox)  Unisom 1/2 tab TID     *No known complications           If received  before Pain:         Known pregnancy;   Darvocet       Resume series after  Lortab        Delivery  Percocet    Yeast:   Tramadol      Femstat  Tylenol 3      Gyne-lotrimin  Ultram       Monistat  Vicodin           MISC:         All Sunscreens           Hair Coloring/highlights          Insect Repellant's          (Including DEET)         Mystic Tans    WHAT OB PATIENTS CAN EXPECT   Confirmation of pregnancy and ultrasound ordered if medically indicated-[redacted] weeks gestation  New OB (NOB) intake with nurse and New OB (NOB) labs- [redacted] weeks gestation  New OB (NOB) physical examination with provider- 11/[redacted] weeks gestation  Flu vaccine-[redacted] weeks gestation  Anatomy scan-[redacted] weeks gestation  Glucose tolerance test, blood work to test for anemia, T-dap vaccine-[redacted] weeks gestation  Vaginal swabs/cultures-STD/Group B strep-[redacted] weeks gestation  Appointments every 4 weeks until 28 weeks  Every 2 weeks from 28 weeks until 36 weeks  Weekly visits from 36 weeks until delivery     Second Trimester of Pregnancy  The second trimester is from week 14 through week 27 (month 4 through 6). This is often the time in pregnancy that you feel your best. Often times, morning sickness has lessened or quit. You may have more energy, and you may get hungry more often. Your unborn baby is growing rapidly. At the end of the sixth month, he or she is about 9 inches long and weighs about 1 pounds. You will likely feel the baby move between 18 and 20 weeks of pregnancy. Follow these instructions at home: Medicines  Take over-the-counter and prescription medicines only as told by your doctor. Some medicines are safe and some medicines are not safe during pregnancy.  Take a prenatal vitamin that contains at least 600 micrograms (mcg) of folic acid.  If you have trouble pooping (constipation), take medicine that will make your stool soft (stool softener) if your doctor approves. Eating and drinking   Eat  regular, healthy meals.  Avoid  raw meat and uncooked cheese.  If you get low calcium from the food you eat, talk to your doctor about taking a daily calcium supplement.  Avoid foods that are high in fat and sugars, such as fried and sweet foods.  If you feel sick to your stomach (nauseous) or throw up (vomit): ? Eat 4 or 5 small meals a day instead of 3 large meals. ? Try eating a few soda crackers. ? Drink liquids between meals instead of during meals.  To prevent constipation: ? Eat foods that are high in fiber, like fresh fruits and vegetables, whole grains, and beans. ? Drink enough fluids to keep your pee (urine) clear or pale yellow. Activity  Exercise only as told by your doctor. Stop exercising if you start to have cramps.  Do not exercise if it is too hot, too humid, or if you are in a place of great height (high altitude).  Avoid heavy lifting.  Wear low-heeled shoes. Sit and stand up straight.  You can continue to have sex unless your doctor tells you not to. Relieving pain and discomfort  Wear a good support bra if your breasts are tender.  Take warm water baths (sitz baths) to soothe pain or discomfort caused by hemorrhoids. Use hemorrhoid cream if your doctor approves.  Rest with your legs raised if you have leg cramps or low back pain.  If you develop puffy, bulging veins (varicose veins) in your legs: ? Wear support hose or compression stockings as told by your doctor. ? Raise (elevate) your feet for 15 minutes, 3-4 times a day. ? Limit salt in your food. Prenatal care  Write down your questions. Take them to your prenatal visits.  Keep all your prenatal visits as told by your doctor. This is important. Safety  Wear your seat belt when driving.  Make a list of emergency phone numbers, including numbers for family, friends, the hospital, and police and fire departments. General instructions  Ask your doctor about the right foods to eat or for help  finding a counselor, if you need these services.  Ask your doctor about local prenatal classes. Begin classes before month 6 of your pregnancy.  Do not use hot tubs, steam rooms, or saunas.  Do not douche or use tampons or scented sanitary pads.  Do not cross your legs for long periods of time.  Visit your dentist if you have not done so. Use a soft toothbrush to brush your teeth. Floss gently.  Avoid all smoking, herbs, and alcohol. Avoid drugs that are not approved by your doctor.  Do not use any products that contain nicotine or tobacco, such as cigarettes and e-cigarettes. If you need help quitting, ask your doctor.  Avoid cat litter boxes and soil used by cats. These carry germs that can cause birth defects in the baby and can cause a loss of your baby (miscarriage) or stillbirth. Contact a doctor if:  You have mild cramps or pressure in your lower belly.  You have pain when you pee (urinate).  You have bad smelling fluid coming from your vagina.  You continue to feel sick to your stomach (nauseous), throw up (vomit), or have watery poop (diarrhea).  You have a nagging pain in your belly area.  You feel dizzy. Get help right away if:  You have a fever.  You are leaking fluid from your vagina.  You have spotting or bleeding from your vagina.  You have severe belly cramping or pain.  You lose or gain weight rapidly.  You have trouble catching your breath and have chest pain.  You notice sudden or extreme puffiness (swelling) of your face, hands, ankles, feet, or legs.  You have not felt the baby move in over an hour.  You have severe headaches that do not go away when you take medicine.  You have trouble seeing. Summary  The second trimester is from week 14 through week 27 (months 4 through 6). This is often the time in pregnancy that you feel your best.  To take care of yourself and your unborn baby, you will need to eat healthy meals, take medicines only if  your doctor tells you to do so, and do activities that are safe for you and your baby.  Call your doctor if you get sick or if you notice anything unusual about your pregnancy. Also, call your doctor if you need help with the right food to eat, or if you want to know what activities are safe for you. This information is not intended to replace advice given to you by your health care provider. Make sure you discuss any questions you have with your health care provider. Document Revised: 09/30/2018 Document Reviewed: 07/14/2016 Elsevier Patient Education  [place].

## 2020-03-04 NOTE — Progress Notes (Signed)
ROB-Doing well, no questions or concerns. Flu vaccine given, see chart. Anticipatory guidance regarding course of prenatal care. Reviewed red flag symptoms and when to call. RTC x 4 weeks for ANATOMY SCAN and ROB or sooner if needed.

## 2020-04-02 ENCOUNTER — Other Ambulatory Visit: Payer: BC Managed Care – PPO

## 2020-04-02 ENCOUNTER — Encounter: Payer: BC Managed Care – PPO | Admitting: Certified Nurse Midwife

## 2020-04-09 ENCOUNTER — Other Ambulatory Visit: Payer: Self-pay

## 2020-04-09 ENCOUNTER — Ambulatory Visit (INDEPENDENT_AMBULATORY_CARE_PROVIDER_SITE_OTHER): Payer: BC Managed Care – PPO | Admitting: Certified Nurse Midwife

## 2020-04-09 ENCOUNTER — Ambulatory Visit (INDEPENDENT_AMBULATORY_CARE_PROVIDER_SITE_OTHER): Payer: BC Managed Care – PPO

## 2020-04-09 VITALS — BP 119/82 | HR 100 | Wt 201.6 lb

## 2020-04-09 DIAGNOSIS — Z3A21 21 weeks gestation of pregnancy: Secondary | ICD-10-CM

## 2020-04-09 DIAGNOSIS — Z3689 Encounter for other specified antenatal screening: Secondary | ICD-10-CM

## 2020-04-09 DIAGNOSIS — Z3402 Encounter for supervision of normal first pregnancy, second trimester: Secondary | ICD-10-CM

## 2020-04-09 LAB — POCT URINALYSIS DIPSTICK OB
Bilirubin, UA: NEGATIVE
Blood, UA: NEGATIVE
Glucose, UA: NEGATIVE
Ketones, UA: NEGATIVE
Leukocytes, UA: NEGATIVE
Nitrite, UA: NEGATIVE
POC,PROTEIN,UA: NEGATIVE
Spec Grav, UA: 1.015 (ref 1.010–1.025)
Urobilinogen, UA: 0.2 E.U./dL
pH, UA: 5 (ref 5.0–8.0)

## 2020-04-09 NOTE — Patient Instructions (Signed)

## 2020-04-09 NOTE — Progress Notes (Signed)
ROB doing well. Feels movement. U/s today for anatomy ( see below) results reviewed. Incomplete for spine. Will complete next visit. Common discomforts of pregnancy discussed. Self help measures reviewed. Follow up 3 wks for u/s completion and ROB with Marcelino Duster.   Location: [facility] Date of Service: 04/09/2020   Indications:Anatomy Ultrasound Findings:  Singleton intrauterine pregnancy is visualized with FHR at 152 BPM. Biometrics give an (U/S) Gestational age of [redacted]w[redacted]d and an (U/S) EDD of 08/21/2020; this correlates with the clinically established Estimated Date of Delivery: 08/20/20  Fetal presentation is transverse.  EFW: 373 g ( 13 oz).  Placenta: anterior. Grade: 1 AFI: subjectively normal.  Anatomic survey is incomplete for Spine and normal; Gender - female.    Right Ovary is normal in appearance. Left Ovary is normal appearance. Survey of the adnexa demonstrates no adnexal masses. There is no free peritoneal fluid in the cul de sac.  Impression: 1. [redacted]w[redacted]d Viable Singleton Intrauterine pregnancy by U/S. 2. (U/S) EDD is consistent with Clinically established Estimated Date of Delivery: 08/20/20 . 3. Incomplete for spine.  Recommendations: 1.Clinical correlation with the patient's History and Physical Exam.   Jenine M. Marciano Sequin    RDMS

## 2020-05-02 ENCOUNTER — Ambulatory Visit (INDEPENDENT_AMBULATORY_CARE_PROVIDER_SITE_OTHER): Payer: BC Managed Care – PPO | Admitting: Certified Nurse Midwife

## 2020-05-02 ENCOUNTER — Other Ambulatory Visit: Payer: Self-pay

## 2020-05-02 ENCOUNTER — Ambulatory Visit (INDEPENDENT_AMBULATORY_CARE_PROVIDER_SITE_OTHER): Payer: BC Managed Care – PPO

## 2020-05-02 ENCOUNTER — Encounter: Payer: Self-pay | Admitting: Certified Nurse Midwife

## 2020-05-02 VITALS — BP 111/78 | HR 78 | Wt 203.2 lb

## 2020-05-02 DIAGNOSIS — Z3A24 24 weeks gestation of pregnancy: Secondary | ICD-10-CM

## 2020-05-02 DIAGNOSIS — Z3A21 21 weeks gestation of pregnancy: Secondary | ICD-10-CM | POA: Diagnosis not present

## 2020-05-02 DIAGNOSIS — Z3402 Encounter for supervision of normal first pregnancy, second trimester: Secondary | ICD-10-CM

## 2020-05-02 LAB — POCT URINALYSIS DIPSTICK OB
Bilirubin, UA: NEGATIVE
Blood, UA: NEGATIVE
Glucose, UA: NEGATIVE
Ketones, UA: NEGATIVE
Leukocytes, UA: NEGATIVE
Nitrite, UA: NEGATIVE
POC,PROTEIN,UA: NEGATIVE
Spec Grav, UA: >=1.03 — AB (ref 1.010–1.025)
Urobilinogen, UA: 0.2 E.U./dL
pH, UA: 6 (ref 5.0–8.0)

## 2020-05-02 NOTE — Progress Notes (Signed)
ROB-Doing well, no questions or concerns. Anatomy scan follow up today is complete and normal, see below. Anticipatory guidance regarding course of prenatal care. Reviewed red flag symptoms and when to call. RTC x 4 weeks for 28 week labs, TDaP, and ROB or sooner if needed   ULTRASOUND REPORT  Location: [facility] Date of Service: 05/02/2020   Indications: Anatomy follow up ultrasound Findings:  Singleton intrauterine pregnancy is visualized with FHR at 153 BPM.  Fetal presentation is Cephalic.  Placenta: anterior. Grade: 1 AFI: subjectively normal.  Anatomic survey is complete.   There is no free peritoneal fluid in the cul de sac.  Impression: 1. [redacted]w[redacted]d Viable Singleton Intrauterine pregnancy previously established criteria. 2. Normal Anatomy Scan is now complete  Recommendations: 1.Clinical correlation with the patient's History and Physical Exam.

## 2020-05-02 NOTE — Patient Instructions (Signed)
WHAT OB PATIENTS CAN EXPECT   Confirmation of pregnancy and ultrasound ordered if medically indicated-[redacted] weeks gestation  New OB (NOB) intake with nurse and New OB (NOB) labs- [redacted] weeks gestation  New OB (NOB) physical examination with provider- 11/[redacted] weeks gestation  Flu vaccine-[redacted] weeks gestation  Anatomy scan-[redacted] weeks gestation  Glucose tolerance test, blood work to test for anemia, T-dap vaccine-[redacted] weeks gestation  Vaginal swabs/cultures-STD/Group B strep-[redacted] weeks gestation  Appointments every 4 weeks until 28 weeks  Every 2 weeks from 28 weeks until 36 weeks  Weekly visits from 36 weeks until delivery   Second Trimester of Pregnancy  The second trimester is from week 14 through week 27 (month 4 through 6). This is often the time in pregnancy that you feel your best. Often times, morning sickness has lessened or quit. You may have more energy, and you may get hungry more often. Your unborn baby is growing rapidly. At the end of the sixth month, he or she is about 9 inches long and weighs about 1 pounds. You will likely feel the baby move between 18 and 20 weeks of pregnancy. Follow these instructions at home: Medicines  Take over-the-counter and prescription medicines only as told by your doctor. Some medicines are safe and some medicines are not safe during pregnancy.  Take a prenatal vitamin that contains at least 600 micrograms (mcg) of folic acid.  If you have trouble pooping (constipation), take medicine that will make your stool soft (stool softener) if your doctor approves. Eating and drinking   Eat regular, healthy meals.  Avoid raw meat and uncooked cheese.  If you get low calcium from the food you eat, talk to your doctor about taking a daily calcium supplement.  Avoid foods that are high in fat and sugars, such as fried and sweet foods.  If you feel sick to your stomach (nauseous) or throw up (vomit): ? Eat 4 or 5 small meals a day instead of 3 large  meals. ? Try eating a few soda crackers. ? Drink liquids between meals instead of during meals.  To prevent constipation: ? Eat foods that are high in fiber, like fresh fruits and vegetables, whole grains, and beans. ? Drink enough fluids to keep your pee (urine) clear or pale yellow. Activity  Exercise only as told by your doctor. Stop exercising if you start to have cramps.  Do not exercise if it is too hot, too humid, or if you are in a place of great height (high altitude).  Avoid heavy lifting.  Wear low-heeled shoes. Sit and stand up straight.  You can continue to have sex unless your doctor tells you not to. Relieving pain and discomfort  Wear a good support bra if your breasts are tender.  Take warm water baths (sitz baths) to soothe pain or discomfort caused by hemorrhoids. Use hemorrhoid cream if your doctor approves.  Rest with your legs raised if you have leg cramps or low back pain.  If you develop puffy, bulging veins (varicose veins) in your legs: ? Wear support hose or compression stockings as told by your doctor. ? Raise (elevate) your feet for 15 minutes, 3-4 times a day. ? Limit salt in your food. Prenatal care  Write down your questions. Take them to your prenatal visits.  Keep all your prenatal visits as told by your doctor. This is important. Safety  Wear your seat belt when driving.  Make a list of emergency phone numbers, including numbers for family, friends,   the hospital, and police and fire departments. General instructions  Ask your doctor about the right foods to eat or for help finding a counselor, if you need these services.  Ask your doctor about local prenatal classes. Begin classes before month 6 of your pregnancy.  Do not use hot tubs, steam rooms, or saunas.  Do not douche or use tampons or scented sanitary pads.  Do not cross your legs for long periods of time.  Visit your dentist if you have not done so. Use a soft toothbrush  to brush your teeth. Floss gently.  Avoid all smoking, herbs, and alcohol. Avoid drugs that are not approved by your doctor.  Do not use any products that contain nicotine or tobacco, such as cigarettes and e-cigarettes. If you need help quitting, ask your doctor.  Avoid cat litter boxes and soil used by cats. These carry germs that can cause birth defects in the baby and can cause a loss of your baby (miscarriage) or stillbirth. Contact a doctor if:  You have mild cramps or pressure in your lower belly.  You have pain when you pee (urinate).  You have bad smelling fluid coming from your vagina.  You continue to feel sick to your stomach (nauseous), throw up (vomit), or have watery poop (diarrhea).  You have a nagging pain in your belly area.  You feel dizzy. Get help right away if:  You have a fever.  You are leaking fluid from your vagina.  You have spotting or bleeding from your vagina.  You have severe belly cramping or pain.  You lose or gain weight rapidly.  You have trouble catching your breath and have chest pain.  You notice sudden or extreme puffiness (swelling) of your face, hands, ankles, feet, or legs.  You have not felt the baby move in over an hour.  You have severe headaches that do not go away when you take medicine.  You have trouble seeing. Summary  The second trimester is from week 14 through week 27 (months 4 through 6). This is often the time in pregnancy that you feel your best.  To take care of yourself and your unborn baby, you will need to eat healthy meals, take medicines only if your doctor tells you to do so, and do activities that are safe for you and your baby.  Call your doctor if you get sick or if you notice anything unusual about your pregnancy. Also, call your doctor if you need help with the right food to eat, or if you want to know what activities are safe for you. This information is not intended to replace advice given to you by  your health care provider. Make sure you discuss any questions you have with your health care provider. Document Revised: 09/30/2018 Document Reviewed: 07/14/2016 Elsevier Patient Education  2020 Elsevier Inc.  

## 2020-05-29 ENCOUNTER — Encounter: Payer: Self-pay | Admitting: Certified Nurse Midwife

## 2020-05-29 ENCOUNTER — Ambulatory Visit (INDEPENDENT_AMBULATORY_CARE_PROVIDER_SITE_OTHER): Payer: BC Managed Care – PPO | Admitting: Certified Nurse Midwife

## 2020-05-29 ENCOUNTER — Other Ambulatory Visit: Payer: Self-pay

## 2020-05-29 ENCOUNTER — Other Ambulatory Visit: Payer: BC Managed Care – PPO

## 2020-05-29 VITALS — BP 126/79 | HR 85 | Wt 212.1 lb

## 2020-05-29 DIAGNOSIS — Z3A28 28 weeks gestation of pregnancy: Secondary | ICD-10-CM | POA: Diagnosis not present

## 2020-05-29 DIAGNOSIS — Z23 Encounter for immunization: Secondary | ICD-10-CM

## 2020-05-29 LAB — POCT URINALYSIS DIPSTICK OB
Bilirubin, UA: NEGATIVE
Blood, UA: NEGATIVE
Glucose, UA: NEGATIVE
Ketones, UA: NEGATIVE
Leukocytes, UA: NEGATIVE
Nitrite, UA: NEGATIVE
POC,PROTEIN,UA: NEGATIVE
Spec Grav, UA: 1.025 (ref 1.010–1.025)
Urobilinogen, UA: 0.2 E.U./dL
pH, UA: 5 (ref 5.0–8.0)

## 2020-05-29 MED ORDER — TETANUS-DIPHTH-ACELL PERTUSSIS 5-2.5-18.5 LF-MCG/0.5 IM SUSY
0.5000 mL | PREFILLED_SYRINGE | Freq: Once | INTRAMUSCULAR | Status: AC
  Administered 2020-05-29: 0.5 mL via INTRAMUSCULAR

## 2020-05-29 NOTE — Progress Notes (Signed)
ROB doing well. Feels good movement. Tdap/BTC/RPR/CBC/Glucose screen today. Discussed RSB, see check list for topics discussed. Information given on birth control after delivery. She is undecided at this time. Sample birth plan given. Will follow up next visit. Pt state she already as a breast pump. Follow up ROB 2 wks with Marcelino Duster.   Doreene Burke, CNM

## 2020-05-29 NOTE — Patient Instructions (Signed)
Td (Tetanus, Diphtheria) Vaccine: What You Need to Know 1. Why get vaccinated? Td vaccine can prevent tetanus and diphtheria. Tetanus enters the body through cuts or wounds. Diphtheria spreads from person to person.  TETANUS (T) causes painful stiffening of the muscles. Tetanus can lead to serious health problems, including being unable to open the mouth, having trouble swallowing and breathing, or death.  DIPHTHERIA (D) can lead to difficulty breathing, heart failure, paralysis, or death. 2. Td vaccine Td is only for children 7 years and older, adolescents, and adults.  Td is usually given as a booster dose every 10 years, but it can also be given earlier after a severe and dirty wound or burn. Another vaccine, called Tdap, that protects against pertussis, also known as "whooping cough," in addition to tetanus and diphtheria, may be used instead of Td.  Td may be given at the same time as other vaccines. 3. Talk with your health care provider Tell your vaccine provider if the person getting the vaccine:  Has had an allergic reaction after a previous dose of any vaccine that protects against tetanus or diphtheria, or has any severe, life-threatening allergies.  Has ever had Guillain-Barr Syndrome (also called GBS).  Has had severe pain or swelling after a previous dose of any vaccine that protects against tetanus or diphtheria. In some cases, your health care provider may decide to postpone Td vaccination to a future visit.  People with minor illnesses, such as a cold, may be vaccinated. People who are moderately or severely ill should usually wait until they recover before getting Td vaccine.  Your health care provider can give you more information. 4. Risks of a vaccine reaction  Pain, redness, or swelling where the shot was given, mild fever, headache, feeling tired, and nausea, vomiting, diarrhea, or stomachache sometimes happen after Td vaccine. People sometimes faint after medical  procedures, including vaccination. Tell your provider if you feel dizzy or have vision changes or ringing in the ears.  As with any medicine, there is a very remote chance of a vaccine causing a severe allergic reaction, other serious injury, or death. 5. What if there is a serious problem? An allergic reaction could occur after the vaccinated person leaves the clinic. If you see signs of a severe allergic reaction (hives, swelling of the face and throat, difficulty breathing, a fast heartbeat, dizziness, or weakness), call 9-1-1 and get the person to the nearest hospital.  For other signs that concern you, call your health care provider.  Adverse reactions should be reported to the Vaccine Adverse Event Reporting System (VAERS). Your health care provider will usually file this report, or you can do it yourself. Visit the VAERS website at www.vaers.hhs.gov or call 1-800-822-7967. VAERS is only for reporting reactions, and VAERS staff do not give medical advice. 6. The National Vaccine Injury Compensation Program The National Vaccine Injury Compensation Program (VICP) is a federal program that was created to compensate people who may have been injured by certain vaccines. Visit the VICP website at www.hrsa.gov/vaccinecompensation or call 1-800-338-2382 to learn about the program and about filing a claim. There is a time limit to file a claim for compensation. 7. How can I learn more?  Ask your health care provider.  Call your local or state health department.  Contact the Centers for Disease Control and Prevention (CDC): ? Call 1-800-232-4636 (1-800-CDC-INFO) or ? Visit CDC's website at www.cdc.gov/vaccines Vaccine Information Statement Td Vaccine (09/21/18) This information is not intended to replace advice given   to you by your health care provider. Make sure you discuss any questions you have with your health care provider. Document Revised: 10/31/2018 Document Reviewed: 10/03/2018 Elsevier  Patient Education  2020 Elsevier Inc.  

## 2020-05-30 LAB — CBC
Hematocrit: 36.8 % (ref 34.0–46.6)
Hemoglobin: 12.1 g/dL (ref 11.1–15.9)
MCH: 29.1 pg (ref 26.6–33.0)
MCHC: 32.9 g/dL (ref 31.5–35.7)
MCV: 89 fL (ref 79–97)
Platelets: 266 10*3/uL (ref 150–450)
RBC: 4.16 x10E6/uL (ref 3.77–5.28)
RDW: 12.2 % (ref 11.7–15.4)
WBC: 12.4 10*3/uL — ABNORMAL HIGH (ref 3.4–10.8)

## 2020-05-30 LAB — GLUCOSE, 1 HOUR GESTATIONAL: Gestational Diabetes Screen: 188 mg/dL — ABNORMAL HIGH (ref 65–139)

## 2020-05-30 LAB — HEPATITIS C ANTIBODY: Hep C Virus Ab: <0.1 s/co ratio (ref 0.0–0.9)

## 2020-05-30 LAB — RPR: RPR Ser Ql: NONREACTIVE

## 2020-06-03 ENCOUNTER — Other Ambulatory Visit: Payer: Self-pay | Admitting: Certified Nurse Midwife

## 2020-06-03 DIAGNOSIS — O24410 Gestational diabetes mellitus in pregnancy, diet controlled: Secondary | ICD-10-CM

## 2020-06-03 NOTE — Progress Notes (Signed)
Elevated 1 hr 188, pt has GDM. Orders placed for diabetic teaching.   Doreene Burke, CNM

## 2020-06-12 ENCOUNTER — Ambulatory Visit: Payer: No Typology Code available for payment source | Admitting: *Deleted

## 2020-06-17 ENCOUNTER — Encounter: Payer: BC Managed Care – PPO | Admitting: Certified Nurse Midwife

## 2020-06-17 ENCOUNTER — Encounter: Payer: Self-pay | Admitting: Certified Nurse Midwife

## 2020-06-17 ENCOUNTER — Ambulatory Visit (INDEPENDENT_AMBULATORY_CARE_PROVIDER_SITE_OTHER): Payer: BC Managed Care – PPO | Admitting: Certified Nurse Midwife

## 2020-06-17 ENCOUNTER — Other Ambulatory Visit: Payer: Self-pay

## 2020-06-17 ENCOUNTER — Telehealth: Payer: Self-pay

## 2020-06-17 DIAGNOSIS — Z20822 Contact with and (suspected) exposure to covid-19: Secondary | ICD-10-CM

## 2020-06-17 DIAGNOSIS — Z3A30 30 weeks gestation of pregnancy: Secondary | ICD-10-CM

## 2020-06-17 DIAGNOSIS — O24419 Gestational diabetes mellitus in pregnancy, unspecified control: Secondary | ICD-10-CM

## 2020-06-17 DIAGNOSIS — Z3403 Encounter for supervision of normal first pregnancy, third trimester: Secondary | ICD-10-CM

## 2020-06-17 NOTE — Progress Notes (Signed)
Routine prenatal televisit. C/o mild cramping, bleeding after intercourse.

## 2020-06-17 NOTE — Progress Notes (Signed)
Virtual Visit via Telephone Note  I connected with [name] on 06/17/20 at  3:45 PM EST by telephone and verified that I am speaking with the correct person using two identifiers.  Location:  Patient: Martha Finley (home)  Provider: Serafina Royals, CNM ([facility], [facility])   I discussed the limitations, risks, security and privacy concerns of performing an evaluation and management service by telephone and the availability of in person appointments. I also discussed with the patient that there may be a patient responsible charge related to this service. The patient expressed understanding and agreed to proceed.   History of Present Illness:  Prenatal visit converted to TELEVISIT due to exposure to COVID-19 exposed person yesterday. Symptom fee, plans testing in three (3) days.   Positive fetal movement. Reports self limiting bleeding/spotting after intercourse and intermittent lower abdominal pain.   Denies difficulty breathing or respiratory distress, chest pain, dysuria, and leg pain or swelling.    Observations/Objective:  None  Assessment:  Encounter for supervision of first normal pregnancy, third trimester 30 weeks of pregnancy Exposure to COVID-19 Gestational diabetes, third trimester  Plan:  Discussed normal changes in pregnancy. Encouraged home treatment measures and use of abdominal support.   Advised to rescheduled [employer] course after testing completed.   Reviewed red flag symptoms and when to call.   Keep prenatal appointments as previously scheduled.   Follow Up Instructions:    I discussed the assessment and treatment plan with the patient. The patient was provided an opportunity to ask questions and all were answered. The patient agreed with the plan and demonstrated an understanding of the instructions.   The patient was advised to call back or seek an in-person evaluation if the symptoms worsen or if the condition fails to improve as  anticipated.  I provided 6 minutes of non-face-to-face time during this encounter.   Serafina Royals, CNM  [facility], [facility] 06/17/20 4:29 PM

## 2020-06-17 NOTE — Telephone Encounter (Signed)
The e-mail states 1. If you are fully vaccinated and 2. Not having any symptoms.

## 2020-06-17 NOTE — Telephone Encounter (Signed)
Pt called in and stated that she was around someone who has been exposed to COVID. The pt was calling to cancel her appt for today and move it out 10 days. The pt stated that she doesn't have any symptoms. The pt stated that she hasn't been fully vaccinated. I asked the pt if she was going to go get tested and she stated that she will be she is going to to wait 3 days. I told the pt I will send a message and let her provider know. The pt verbally understood.

## 2020-06-17 NOTE — Patient Instructions (Signed)
Fetal Movement Counts Patient Name: ________________________________________________ Patient Due Date: ____________________ What is a fetal movement count?  A fetal movement count is the number of times that you feel your baby move during a certain amount of time. This may also be called a fetal kick count. A fetal movement count is recommended for every pregnant woman. You may be asked to start counting fetal movements as early as week 28 of your pregnancy. Pay attention to when your baby is most active. You may notice your baby's sleep and wake cycles. You may also notice things that make your baby move more. You should do a fetal movement count:  When your baby is normally most active.  At the same time each day. A good time to count movements is while you are resting, after having something to eat and drink. How do I count fetal movements? 1. Find a quiet, comfortable area. Sit, or lie down on your side. 2. Write down the date, the start time and stop time, and the number of movements that you felt between those two times. Take this information with you to your health care visits. 3. Write down your start time when you feel the first movement. 4. Count kicks, flutters, swishes, rolls, and jabs. You should feel at least 10 movements. 5. You may stop counting after you have felt 10 movements, or if you have been counting for 2 hours. Write down the stop time. 6. If you do not feel 10 movements in 2 hours, contact your health care provider for further instructions. Your health care provider may want to do additional tests to assess your baby's well-being. Contact a health care provider if:  You feel fewer than 10 movements in 2 hours.  Your baby is not moving like he or she usually does. Date: ____________ Start time: ____________ Stop time: ____________ Movements: ____________ Date: ____________ Start time: ____________ Stop time: ____________ Movements: ____________ Date: ____________  Start time: ____________ Stop time: ____________ Movements: ____________ Date: ____________ Start time: ____________ Stop time: ____________ Movements: ____________ Date: ____________ Start time: ____________ Stop time: ____________ Movements: ____________ Date: ____________ Start time: ____________ Stop time: ____________ Movements: ____________ Date: ____________ Start time: ____________ Stop time: ____________ Movements: ____________ Date: ____________ Start time: ____________ Stop time: ____________ Movements: ____________ Date: ____________ Start time: ____________ Stop time: ____________ Movements: ____________ This information is not intended to replace advice given to you by your health care provider. Make sure you discuss any questions you have with your health care provider. Document Revised: 01/26/2019 Document Reviewed: 01/26/2019 Elsevier Patient Education  2020 Elsevier Inc.  

## 2020-06-20 NOTE — Telephone Encounter (Signed)
New protocols given for pts with covid, suspected, or exposed.

## 2020-06-22 NOTE — L&D Delivery Note (Addendum)
° ° °     Delivery Note   Martha Finley is a 25 y.o. G1P0000 at [redacted]w[redacted]d Estimated Date of Delivery: 08/20/20  PRE-OPERATIVE DIAGNOSIS:  1) [redacted]w[redacted]d pregnancy. Gestational diabetes diet controlled    POST-OPERATIVE DIAGNOSIS:  1) [redacted]w[redacted]d pregnancy s/p Vaginal, Spontaneous , vacuum assisted    Delivery Type: Vaginal, Spontaneous    Delivery Anesthesia: Epidural   Labor Complications:  none    ESTIMATED BLOOD LOSS: 375 ml    FINDINGS:   1) female infant, Apgar scores of 5   at 1 minute and 8   at 5 minutes and a birthweight of   ounces.    2) Nuchal cord: yes, x1 reduced  SPECIMENS:   PLACENTA:   Appearance: Intact , 3 vessel cord   Removal: Spontaneous      Disposition:  per protocol   DISPOSITION:  Infant to left in stable condition in the delivery room, with L&D personnel and mother,  NARRATIVE SUMMARY: Labor course:  Martha Finley is a G1P0000 at [redacted]w[redacted]d who presented for labor management.  She progressed well in labor with pitocin.  She received the appropriate epidural anesthesia and proceeded to complete dilation.  She evidenced poor maternal expulsive effort during the second stage due to maternal exhaustion. Dr. Logan Bores was called to bedside for vacuum assisted delivery. A kiwi vacuum was placed on fetal scalp at +2 station. The kiwi Vacuum was used for a total of 2 ctx with one pop off. The bell vacuum was then applied to the fetal head at + 3 station and pulled for 2 ctx when a pop off occurred. The fetal head was now plus four station. The vacuum was not reapplied the pt pushed x 2 more contractions then  she went on to deliver a viable  Female infant "Martha Finley". The placenta delivered without problems and was noted to be complete. A perineal and vaginal examination was performed. Laceration: Right and left side wall tear that was repaired with 3-0 vicryl Rapide suture.  The patient tolerated this well.  Doreene Burke, CNM  08/14/2020 10:36 AM

## 2020-06-25 ENCOUNTER — Encounter: Payer: Self-pay | Admitting: *Deleted

## 2020-06-25 ENCOUNTER — Telehealth: Payer: Self-pay

## 2020-06-25 ENCOUNTER — Other Ambulatory Visit: Payer: Self-pay

## 2020-06-25 ENCOUNTER — Encounter: Payer: BC Managed Care – PPO | Attending: Certified Nurse Midwife | Admitting: *Deleted

## 2020-06-25 VITALS — BP 100/60 | Ht 66.0 in | Wt 214.6 lb

## 2020-06-25 DIAGNOSIS — O24410 Gestational diabetes mellitus in pregnancy, diet controlled: Secondary | ICD-10-CM | POA: Insufficient documentation

## 2020-06-25 MED ORDER — GLUCOSE BLOOD VI STRP: ORAL_STRIP | 12 refills | Status: DC

## 2020-06-25 MED ORDER — MICROLET LANCETS MISC: 4.0000 | Freq: Four times a day (QID) | 12 refills | Status: DC

## 2020-06-25 NOTE — Progress Notes (Signed)
Diabetes Self-Management Education  Visit Type: First/Initial  Appt. Start Time: 1315 Appt. End Time: 1445  06/25/2020  Ms. Martha Finley, identified by name and date of birth, is a 25 y.o. female with a diagnosis of Diabetes: Gestational Diabetes.   ASSESSMENT  Blood pressure 100/60, height 5\' 6"  (1.676 m), weight 214 lb 9.6 oz (97.3 kg), last menstrual period 10/28/2019, estimated date of delivery 08/20/2020 Body mass index is 34.64 kg/m.   Diabetes Self-Management Education - 06/25/20 1522      Visit Information   Visit Type First/Initial      Initial Visit   Diabetes Type Gestational Diabetes    Are you currently following a meal plan? Yes    What type of meal plan do you follow? "more lean meat and veggies"    Are you taking your medications as prescribed? Yes    Date Diagnosed 3 weeks      Health Coping   How would you rate your overall health? Good      Psychosocial Assessment   Patient Belief/Attitude about Diabetes Other (comment)   "nervous"   Self-care barriers None    Self-management support Doctor's office;Family    Other persons present Spouse/SO    Patient Concerns Nutrition/Meal planning;Glycemic Control;Monitoring;Healthy Lifestyle    Special Needs None    Preferred Learning Style Visual    Learning Readiness Change in progress    How often do you need to have someone help you when you read instructions, pamphlets, or other written materials from your doctor or pharmacy? 1 - Never    What is the last grade level you completed in school? Associate degree      Pre-Education Assessment   Patient understands the diabetes disease and treatment process. Needs Instruction    Patient understands incorporating nutritional management into lifestyle. Needs Instruction    Patient undertands incorporating physical activity into lifestyle. Needs Review    Patient understands using medications safely. Needs Instruction    Patient understands monitoring blood glucose,  interpreting and using results Needs Instruction    Patient understands prevention, detection, and treatment of acute complications. Needs Instruction    Patient understands prevention, detection, and treatment of chronic complications. Needs Instruction    Patient understands how to develop strategies to address psychosocial issues. Needs Instruction    Patient understands how to develop strategies to promote health/change behavior. Needs Instruction      Complications   How often do you check your blood sugar? 0 times/day (not testing)   Pt reports she has been using her Dad's meter but not since the holidays. Provided Contour Next EZ meter and instructed on use. BG upon return demonstration was 125 mg/dL at 08/23/20 pm 2 hrs pp   Have you had a dilated eye exam in the past 12 months? No    Have you had a dental exam in the past 12 months? No    Are you checking your feet? No      Dietary Intake   Breakfast fruit, eggs, 3:23 bacon; protein drink    Lunch soup, broth, veggies - zucchini, had Hibachi today with rice    Snack (afternoon) nuts    Dinner chicken, rice, broccoli, zucchini, peas, beans, salad with fruit    Snack (evening) fruit (bananas, mandarin oranges, berries)    Beverage(s) water, juice      Exercise   Exercise Type Light (walking / raking leaves)    How many days per week to you exercise? 3  How many minutes per day do you exercise? 60    Total minutes per week of exercise 180      Patient Education   Previous Diabetes Education No    Disease state  Definition of diabetes, type 1 and 2, and the diagnosis of diabetes;Factors that contribute to the development of diabetes    Nutrition management  Role of diet in the treatment of diabetes and the relationship between the three main macronutrients and blood glucose level;Food label reading, portion sizes and measuring food.;Reviewed blood glucose goals for pre and post meals and how to evaluate the patients' food intake on  their blood glucose level.    Physical activity and exercise  Role of exercise on diabetes management, blood pressure control and cardiac health.    Medications Other (comment)   Limited use of oral medications during pregnancy and potential for insulin.   Monitoring Taught/evaluated SMBG meter.;Purpose and frequency of SMBG.;Taught/discussed recording of test results and interpretation of SMBG.;Ketone testing, when, how.    Chronic complications Relationship between chronic complications and blood glucose control    Psychosocial adjustment Identified and addressed patients feelings and concerns about diabetes    Preconception care Pregnancy and GDM  Role of pre-pregnancy blood glucose control on the development of the fetus;Reviewed with patient blood glucose goals with pregnancy;Role of family planning for patients with diabetes      Individualized Goals (developed by patient)   Reducing Risk Other (comment)   improve blood sugars, prevent diabetes complications, lead a healthier lifestyle     Outcomes   Expected Outcomes Demonstrated interest in learning. Expect positive outcomes    Program Status Not Completed           Individualized Plan for Diabetes Self-Management Training:   Learning Objective:  Patient will have a greater understanding of diabetes self-management. Patient education plan is to attend individual and/or group sessions per assessed needs and concerns.   Plan:   Patient Instructions  Read booklet on Gestational Diabetes Follow Gestational Meal Planning Guidelines Avoid sugar sweetened drinks (juice) Limit fruit at breakfast if blood sugars elevated Include 1 serving of protein with eating fruit for a snack Check blood sugars 4 x day - before breakfast and 2 hrs after every meal and record  Bring blood sugar log to all appointments Call MD for prescription for meter strips and lancets Strips   Contour Next Lancets   Contour Microlet Purchase urine ketone  strips if instructed by MD check urine ketones every am:  If + increase bedtime snack to 1 protein and 2 carbohydrate servings Walk 20-30 minutes at least 5 x week if permitted by MD  Expected Outcomes:  Demonstrated interest in learning. Expect positive outcomes  Education material provided:  Gestational Booklet Gestational Meal Planning Guidelines Simple Meal Plan Viewed Gestational Diabetes Video Meter = Contour Next EZ Goals for a Healthy Pregnancy  If problems or questions, patient to contact team via:  Sharion Settler, RN, CCM, CDCES [phone]  Future DSME appointment:  Patient wants to work on meal planning at home and call back if she needs to see a dietitian.

## 2020-06-25 NOTE — Telephone Encounter (Signed)
Orders for glucose testing sent to pharmacy

## 2020-06-25 NOTE — Patient Instructions (Signed)
Read booklet on Gestational Diabetes Follow Gestational Meal Planning Guidelines Avoid sugar sweetened drinks (juice) Limit fruit at breakfast if blood sugars elevated Include 1 serving of protein with eating fruit for a snack Check blood sugars 4 x day - before breakfast and 2 hrs after every meal and record  Bring blood sugar log to all appointments Call MD for prescription for meter strips and lancets Strips   Contour Next Lancets   Contour Microlet Purchase urine ketone strips if instructed by MD check urine ketones every am:  If + increase bedtime snack to 1 protein and 2 carbohydrate servings Walk 20-30 minutes at least 5 x week if permitted by MD

## 2020-06-25 NOTE — Telephone Encounter (Signed)
Pt called in and stated that she had an appointment with [facility] and they are requesting microlet lancets  test strips contour next sent to [facility] in [place]. Please advise

## 2020-06-28 ENCOUNTER — Encounter: Payer: BC Managed Care – PPO | Admitting: Certified Nurse Midwife

## 2020-07-08 ENCOUNTER — Encounter: Payer: BC Managed Care – PPO | Admitting: Certified Nurse Midwife

## 2020-07-09 ENCOUNTER — Ambulatory Visit (INDEPENDENT_AMBULATORY_CARE_PROVIDER_SITE_OTHER): Payer: BC Managed Care – PPO | Admitting: Certified Nurse Midwife

## 2020-07-09 ENCOUNTER — Other Ambulatory Visit: Payer: Self-pay

## 2020-07-09 ENCOUNTER — Encounter: Payer: Self-pay | Admitting: Certified Nurse Midwife

## 2020-07-09 VITALS — BP 118/81 | HR 99 | Wt 216.1 lb

## 2020-07-09 DIAGNOSIS — Z3A34 34 weeks gestation of pregnancy: Secondary | ICD-10-CM

## 2020-07-09 LAB — POCT URINALYSIS DIPSTICK OB
Bilirubin, UA: NEGATIVE
Blood, UA: NEGATIVE
Glucose, UA: NEGATIVE
Ketones, UA: NEGATIVE
Leukocytes, UA: NEGATIVE
Nitrite, UA: NEGATIVE
POC,PROTEIN,UA: NEGATIVE
Spec Grav, UA: 1.01 (ref 1.010–1.025)
Urobilinogen, UA: 0.2 E.U./dL
pH, UA: 5 (ref 5.0–8.0)

## 2020-07-09 NOTE — Patient Instructions (Signed)

## 2020-07-09 NOTE — Progress Notes (Signed)
ROB doing well. Feels good movement. BS log reviewed. fastings 93-98 with one elevated 101. 2 hr pp 95-120 with 2 elevated levels at 121, 122. Discussed GBS and cultures next visit . She verbalizes and agrees. Follow up 2 wks or prn with Marcelino Duster.   Doreene Burke, CNM

## 2020-07-09 NOTE — Addendum Note (Signed)
Addended by: Brooke Dare on: 07/09/2020 03:59 PM   Modules accepted: Orders

## 2020-07-18 IMAGING — US US OB < 14 WEEKS - US OB TV
1 series · 14 of 28 positions shown · non-contrast
Comparison: None.

CLINICAL DATA: Vaginal bleeding.

EXAM:
OBSTETRIC <14 WK US AND TRANSVAGINAL OB US
TECHNIQUE: Both transabdominal and transvaginal ultrasound examinations were
performed for complete evaluation of the gestation as well as the
maternal uterus, adnexal regions, and pelvic cul-de-sac.
Transvaginal technique was performed to assess early pregnancy.

[Series 1: us ob transvaginal · 14 of 101 slices shown]
[im 4/101]
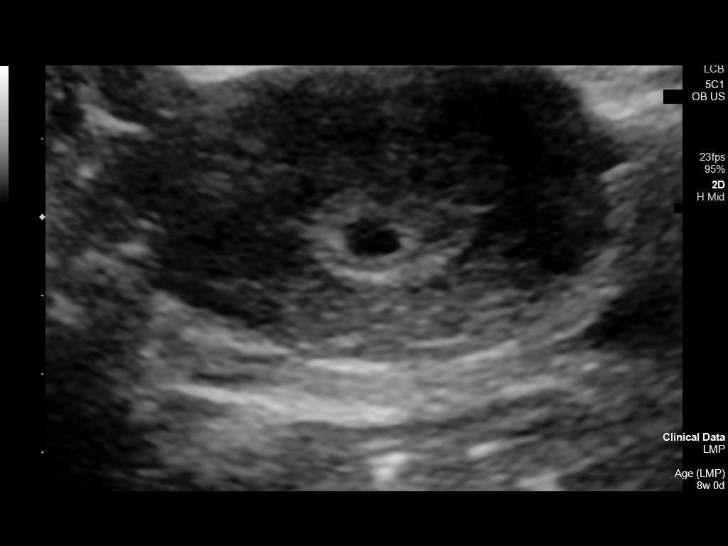
[im 12/101]
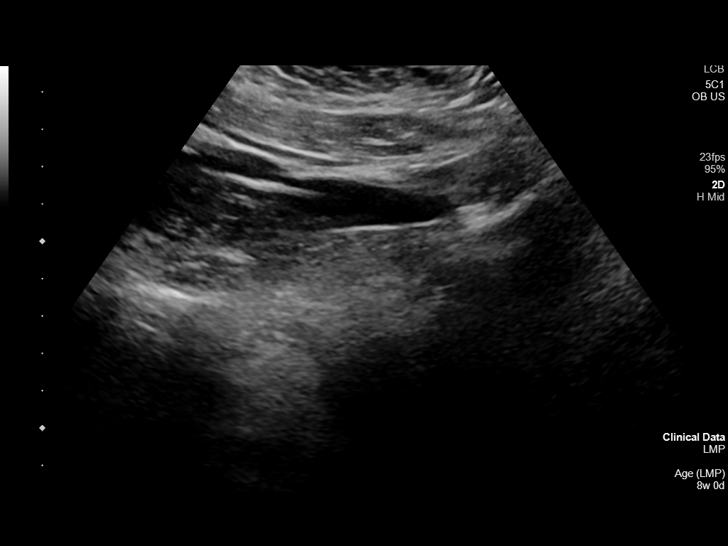
[im 19/101]
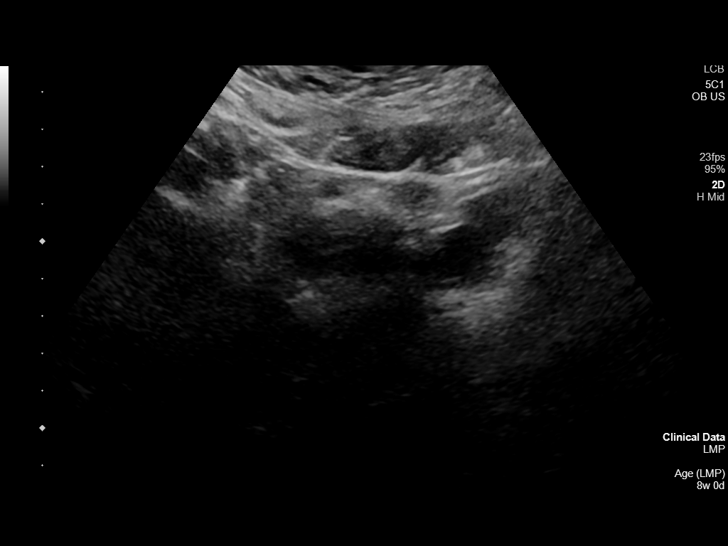
[im 26/101]
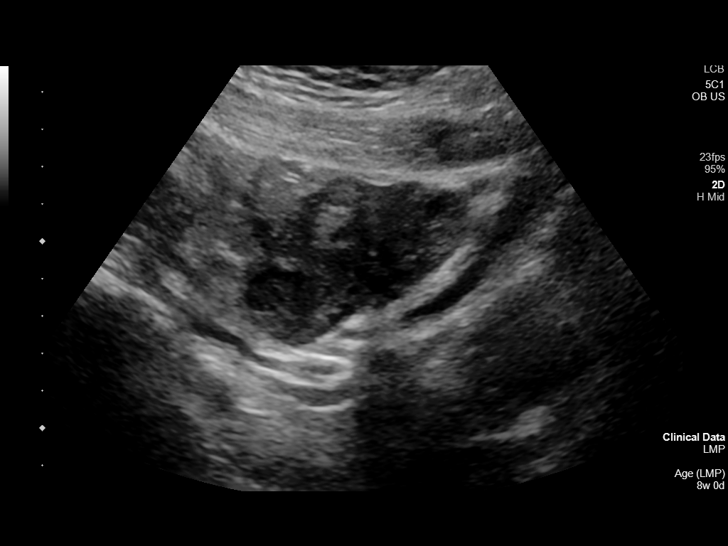
[im 34/101]
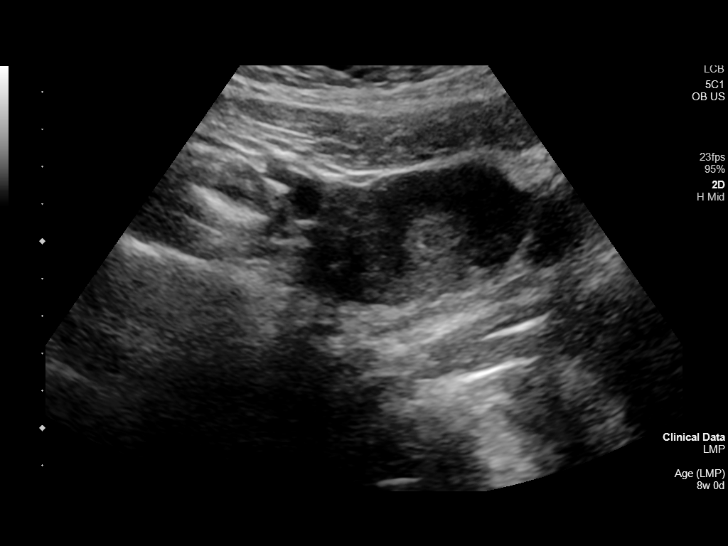
[im 41/101]
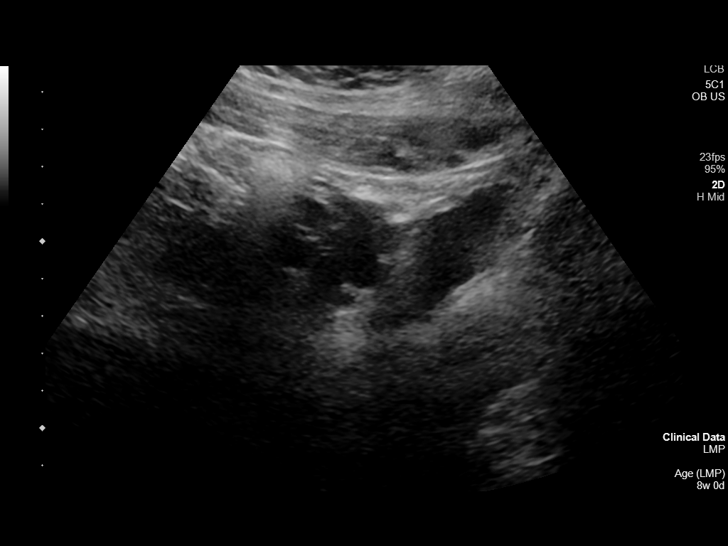
[im 49/101]
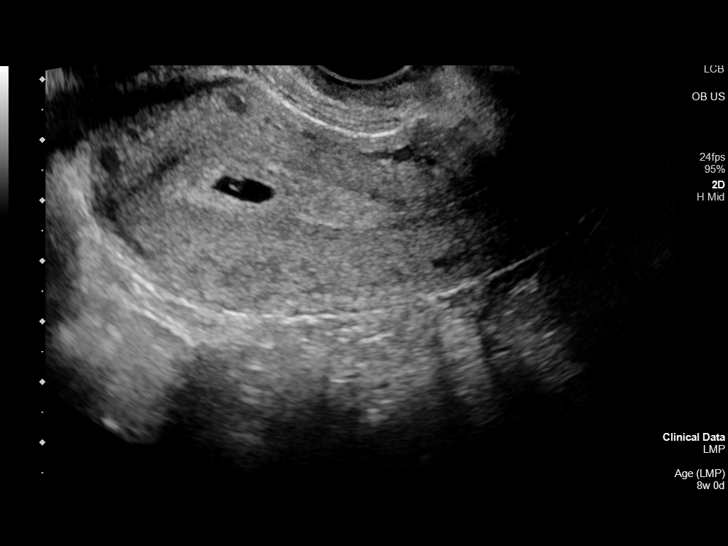
[im 56/101]
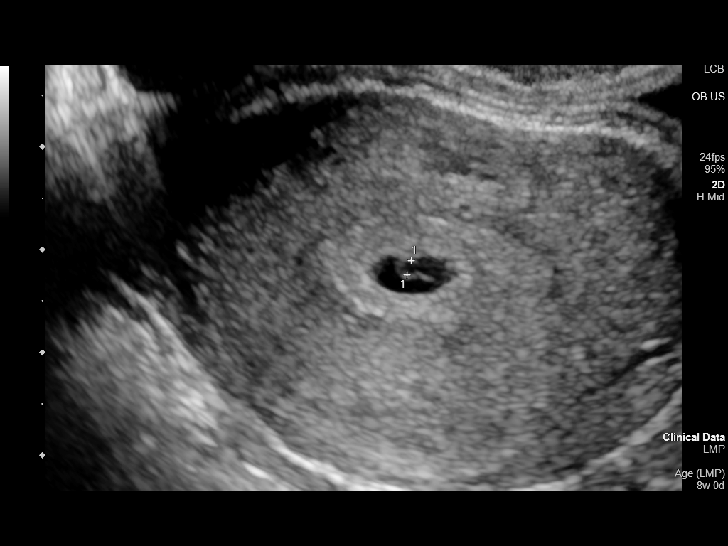
[im 63/101]
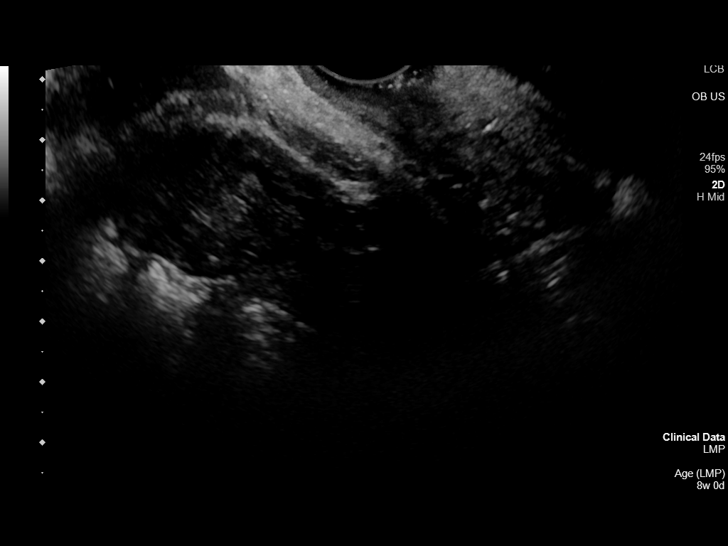
[im 71/101]
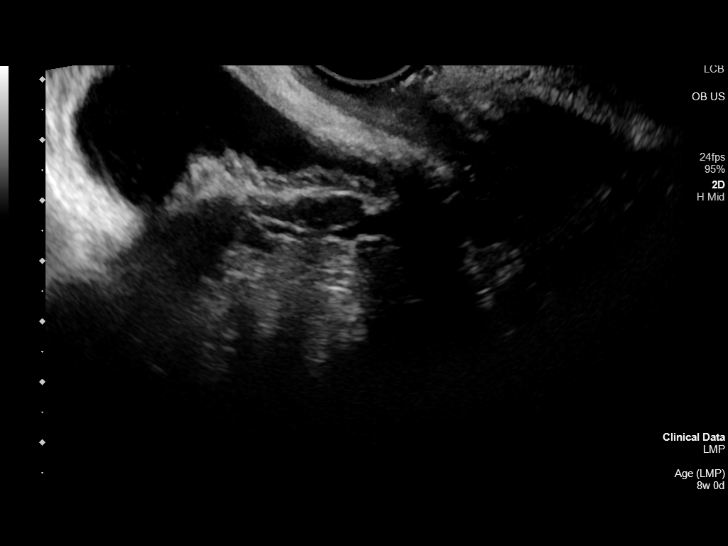
[im 78/101]
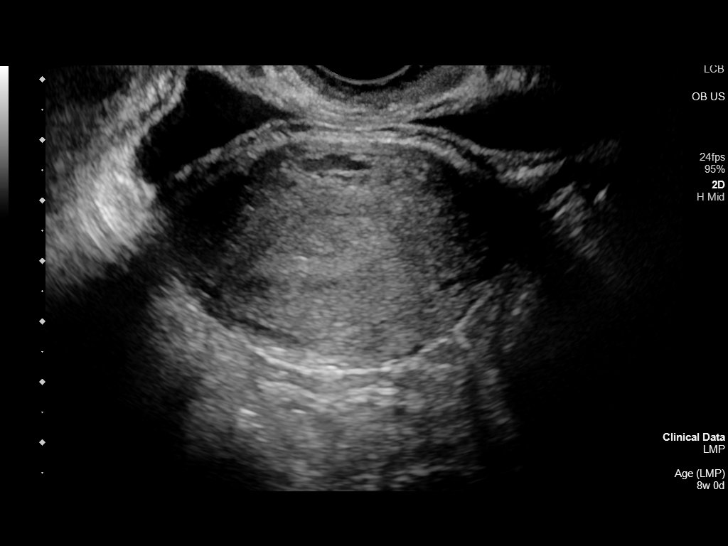
[im 86/101]
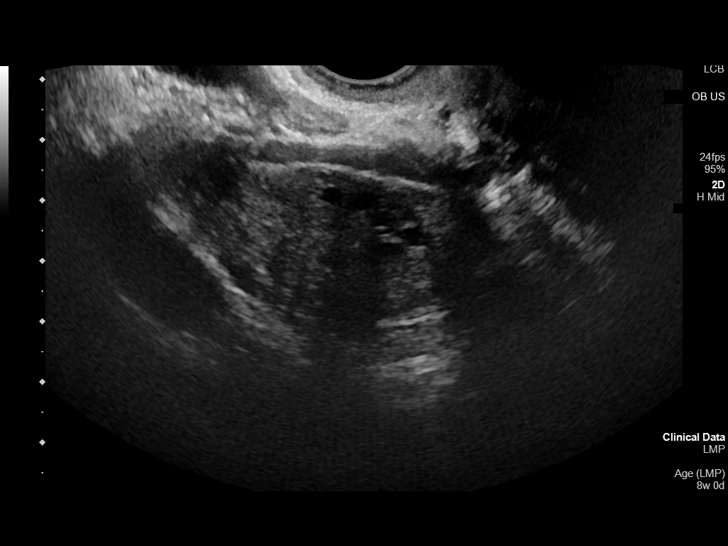
[im 93/101]
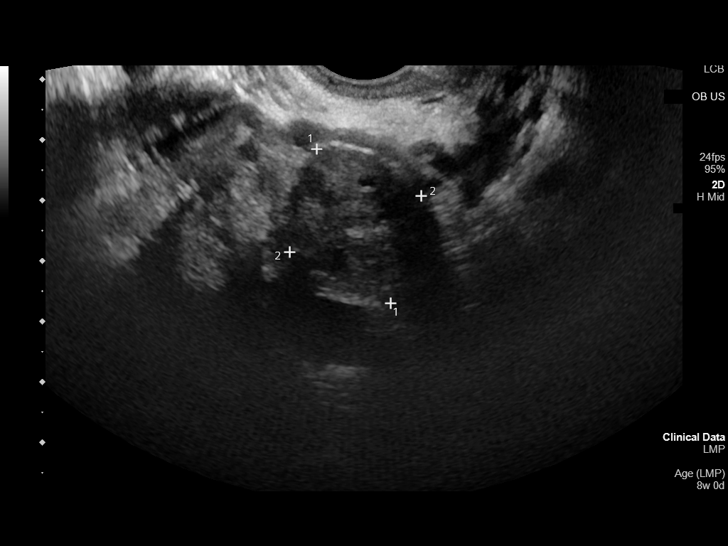
[im 101/101]
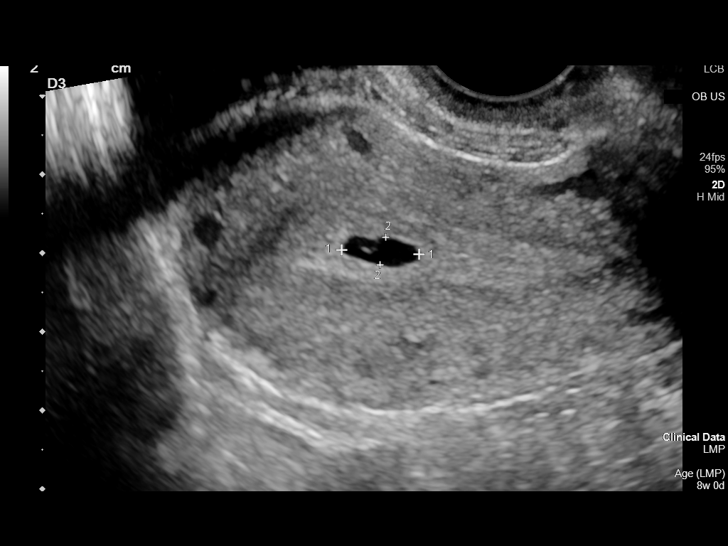

[14 of 28 positions shown; findings below may reference images not displayed]

FINDINGS: Intrauterine gestational sac: Single

Yolk sac:  Visualized.

Embryo:  Not Visualized.

Cardiac Activity: Not Visualized.

Heart Rate: N/A  bpm

MSD: 7.5 mm   5 w   4 d

Subchorionic hemorrhage:  None visualized.

Maternal uterus/adnexae:

The right ovary measures 4.7 cm x 2.5 cm x 2.1 cm and is normal in
appearance.

The left ovary measures 2.8 cm x 2.3 cm x 1.8 cm and is normal in
appearance.

No free fluid is identified.
IMPRESSION: Single intrauterine gestational sac and yolk sac, at approximately 5
weeks and 4 days gestation by ultrasound evaluation, without
visualization of a fetal pole. This may be secondary to early
intrauterine pregnancy. Correlation with follow-up pelvic ultrasound
and serial beta HCG levels is recommended.

## 2020-07-25 ENCOUNTER — Other Ambulatory Visit: Payer: Self-pay

## 2020-07-25 ENCOUNTER — Ambulatory Visit (INDEPENDENT_AMBULATORY_CARE_PROVIDER_SITE_OTHER): Payer: BC Managed Care – PPO | Admitting: Certified Nurse Midwife

## 2020-07-25 VITALS — BP 111/78 | HR 91 | Wt 219.3 lb

## 2020-07-25 DIAGNOSIS — Z113 Encounter for screening for infections with a predominantly sexual mode of transmission: Secondary | ICD-10-CM

## 2020-07-25 DIAGNOSIS — Z3685 Encounter for antenatal screening for Streptococcus B: Secondary | ICD-10-CM

## 2020-07-25 DIAGNOSIS — Z6710 Type A blood, Rh positive: Secondary | ICD-10-CM

## 2020-07-25 DIAGNOSIS — Z3A36 36 weeks gestation of pregnancy: Secondary | ICD-10-CM

## 2020-07-25 DIAGNOSIS — O24410 Gestational diabetes mellitus in pregnancy, diet controlled: Secondary | ICD-10-CM

## 2020-07-25 DIAGNOSIS — Z3403 Encounter for supervision of normal first pregnancy, third trimester: Secondary | ICD-10-CM

## 2020-07-25 LAB — POCT URINALYSIS DIPSTICK OB
Bilirubin, UA: NEGATIVE
Glucose, UA: NEGATIVE
Ketones, UA: NEGATIVE
Leukocytes, UA: NEGATIVE
Nitrite, UA: NEGATIVE
POC,PROTEIN,UA: NEGATIVE
Spec Grav, UA: >=1.03 — AB (ref 1.010–1.025)
Urobilinogen, UA: 0.2 E.U./dL
pH, UA: 5 (ref 5.0–8.0)

## 2020-07-25 NOTE — Patient Instructions (Signed)
First Stage of Labor Labor is your body's natural process of moving your baby and other structures, including the placenta and umbilical cord, out of your uterus. There are three stages of labor. How long each stage lasts is different for every woman. But certain events happen during each stage that are the same for everyone.  The first stage starts when true labor begins. This stage ends when your cervix, which is the opening from your uterus into your vagina, is completely open (dilated).  The second stage begins when your cervix is fully dilated and you start pushing. This stage ends when your baby is born.  The third stage is the delivery of the organ that nourished your baby during pregnancy (placenta). First stage of labor As your due date gets closer, you may start to notice certain physical changes that mean labor is going to start soon. You may feel that your baby has dropped lower into your pelvis. You may experience irregular, often painless, contractions that go away when you walk around or lie down (Braxton Hicks contractions). This is also called false labor. The first stage of labor begins when you start having contractions that come at regular (evenly spaced) intervals and your cervix starts to get thinner and wider in preparation for your baby to pass through. Birth care providers measure the dilation of your cervix in centimeters (cm). One centimeter is a little less than one-half of an inch. The first stage ends when your cervix is dilated to 10 cm. The first stage of labor is divided into three phases:  Early phase.  Active phase.  Transitional phase. The length of the first stage of labor varies. It may be longer if this is your first pregnancy. You may spend most of this stage at home trying to relax and stay comfortable. How does this affect me? During the first stage of labor, you will move through three phases. What happens in the early phase?  You will start to have  regular contractions that last 30-60 seconds. Contractions may come every 5-20 minutes. Keep track of your contractions and call your birth care provider.  Your water may break during this phase.  You may notice a clear or slightly bloody discharge of mucus (mucus plug) from your vagina.  Your cervix will dilate to 3-6 cm. What happens in the active phase? The active phase usually lasts 3-5 hours. You may go to the hospital or birth center around this time. During the active phase:  Your contractions will become stronger, longer, and more uncomfortable.  Your contractions may last 45-90 seconds and come every 3-5 minutes.  You may feel lower back pain.  Your birth care providers may examine your cervix and feel your belly to find the position of your baby.  You may have a monitor strapped to your belly to measure your contractions and your baby's heart rate.  You may start using your pain management options.  Your cervix may be dilated to 6 cm and may start to dilate more quickly. What happens in the transitional phase? The transitional phase typically lasts from 30 minutes to 2 hours. At the end of this phase, your cervix will be fully dilated to 10 cm. During the transitional phase:  Contractions will get stronger and longer.  Contractions may last 60-90 seconds and come less than 2 minutes apart.  You may feel hot flashes, chills, or nausea. How does this affect my baby? During the first stage of labor, your baby will   gradually move down into your birth canal. Follow these instructions at home and in the hospital or birth center:  When labor first begins, try to stay calm. You are still in the early phase. If it is night, try to get some sleep. If it is day, try to relax and save your energy. You may want to make some calls and get ready to go to the hospital or birth center.  When you are in the early phase, try these methods to help ease discomfort: ? Deep breathing and  muscle relaxation. ? Taking a walk. ? Taking a warm bath or shower.  Drink some fluids and have a light snack if you feel like it.  Keep track of your contractions.  Based on the plan you created with your birth care provider, call when your contractions indicate it is time.  If your water breaks, note the time, color, and odor of the fluid.  When you are in the active phase, do your breathing exercises and rely on your support people and your team of birth care providers.   Contact a health care provider if:  Your contractions are strong and regular.  You have lower back pain or cramping.  Your water breaks.  You lose your mucus plug. Get help right away if you:  Have a severe headache that does not go away.  Have changes in your vision.  Have severe pain in your upper belly.  Do not feel the baby move.  Have bright red bleeding. Summary  The first stage of labor starts when true labor begins, and it ends when your cervix is dilated to 10 cm.  The first stage of labor has three phases: early, active, and transitional.  Your baby moves into the birth canal during the first stage of labor.  You may have contractions that become stronger and longer. You may also lose your mucus plug and have your water break.  Call your birth care provider when your contractions are frequent and strong enough to go to the hospital or birth center. This information is not intended to replace advice given to you by your health care provider. Make sure you discuss any questions you have with your health care provider. Document Revised: 09/29/2018 Document Reviewed: 08/22/2017 Elsevier Patient Education  2021 [employer]   Fetal Movement Counts Patient Name: ________________________________________________ Patient Due Date: ____________________  What is a fetal movement count? A fetal movement count is the number of times that you feel your baby move during a certain amount of time.  This may also be called a fetal kick count. A fetal movement count is recommended for every pregnant woman. You may be asked to start counting fetal movements as early as week 28 of your pregnancy. Pay attention to when your baby is most active. You may notice your baby's sleep and wake cycles. You may also notice things that make your baby move more. You should do a fetal movement count:  When your baby is normally most active.  At the same time each day. A good time to count movements is while you are resting, after having something to eat and drink. How do I count fetal movements? 1. Find a quiet, comfortable area. Sit, or lie down on your side. 2. Write down the date, the start time and stop time, and the number of movements that you felt between those two times. Take this information with you to your health care visits. 3. Write down your start time   when you feel the first movement. 4. Count kicks, flutters, swishes, rolls, and jabs. You should feel at least 10 movements. 5. You may stop counting after you have felt 10 movements, or if you have been counting for 2 hours. Write down the stop time. 6. If you do not feel 10 movements in 2 hours, contact your health care provider for further instructions. Your health care provider may want to do additional tests to assess your baby's well-being. Contact a health care provider if:  You feel fewer than 10 movements in 2 hours.  Your baby is not moving like he or she usually does. Date: ____________ Start time: ____________ Stop time: ____________ Movements: ____________ Date: ____________ Start time: ____________ Stop time: ____________ Movements: ____________ Date: ____________ Start time: ____________ Stop time: ____________ Movements: ____________ Date: ____________ Start time: ____________ Stop time: ____________ Movements: ____________ Date: ____________ Start time: ____________ Stop time: ____________ Movements: ____________ Date:  ____________ Start time: ____________ Stop time: ____________ Movements: ____________ Date: ____________ Start time: ____________ Stop time: ____________ Movements: ____________ Date: ____________ Start time: ____________ Stop time: ____________ Movements: ____________ Date: ____________ Start time: ____________ Stop time: ____________ Movements: ____________ This information is not intended to replace advice given to you by your health care provider. Make sure you discuss any questions you have with your health care provider. Document Revised: 01/26/2019 Document Reviewed: 01/26/2019 Elsevier Patient Education  2021 Else[employer] Inc.   Group B Streptococcus Test During Pregnancy Why am I having this test? Routine testing, also called screening, for group B streptococcus (GBS) is recommended for all pregnant women between the 36th and 37th week of pregnancy. GBS is a type of bacteria that can be passed from mother to baby during childbirth. Screening will help guide whether or not you will need treatment during labor and delivery to prevent complications such as:  An infection in your uterus during labor.  An infection in your uterus after delivery.  A serious infection in your baby after delivery, such as pneumonia, meningitis, or sepsis. GBS screening is not often done before 36 weeks of pregnancy unless you go into labor prematurely. What happens if I have group B streptococcus? If testing shows that you have GBS, your health care provider will recommend treatment with IV antibiotics during labor and delivery. This treatment significantly decreases the risk of complications for you and your baby. If you have a planned C-section and you have GBS, you may not need to be treated with antibiotics because GBS is usually passed to babies after labor starts and your water breaks. If you are in labor or your water breaks before your C-section, it is possible for GBS to get into your uterus and be passed  to your baby, so you might need treatment. Is there a chance I may not need to be tested? You may not need to be tested for GBS if:  You have a urine test that shows GBS before 36 to 37 weeks.  You had a baby with GBS infection after a previous delivery. In these cases, you will automatically be treated for GBS during labor and delivery. What is being tested? This test is done to check if you have group B streptococcus in your vagina or rectum. What kind of sample is taken? To collect samples for this test, your health care provider will swab your vagina and rectum with a cotton swab. The sample is then sent to the lab to see if GBS is present. What happens during the test?  You will remove your clothing from the waist down.  You will lie down on an exam table in the same position as you would for a pelvic exam.  Your health care provider will swab your vagina and rectum to collect samples for a culture test.  You will be able to go home after the test and do all your usual activities.   How are the results reported? The test results are reported as positive or negative. What do the results mean?  A positive test means you are at risk for passing GBS to your baby during labor and delivery. Your health care provider will recommend that you are treated with an IV antibiotic during labor and delivery.  A negative test means you are at very low risk of passing GBS to your baby. There is still a low risk of passing GBS to your baby because sometimes test results may report that you do not have a condition when you do (false-negative result) or there is a chance that you may become infected with GBS after the test is done. You most likely will not need to be treated with an antibiotic during labor and delivery. Talk with your health care provider about what your results mean. Questions to ask your health care provider Ask your health care provider, or the department that is doing the  test:  When will my results be ready?  How will I get my results?  What are my treatment options? Summary  Routine testing (screening) for group B streptococcus (GBS) is recommended for all pregnant women between the 36th and 37th week of pregnancy.  GBS is a type of bacteria that can be passed from mother to baby during childbirth.  If testing shows that you have GBS, your health care provider will recommend that you are treated with IV antibiotics during labor and delivery. This treatment almost always prevents infection in newborns. This information is not intended to replace advice given to you by your health care provider. Make sure you discuss any questions you have with your health care provider. Document Revised: 04/09/2020 Document Reviewed: 07/06/2018 Elsevier Patient Education  2021 [employer].

## 2020-07-26 DIAGNOSIS — Z6710 Type A blood, Rh positive: Secondary | ICD-10-CM | POA: Insufficient documentation

## 2020-07-26 NOTE — Progress Notes (Addendum)
ROB-Doing well, no questions or concerns. Blood sugar log sent via MyChart all results wnl. Requests SVE. 36 week cultures collected, see orders. Pre-labor checklist, birth affirmations, and herbal prep guide given. Anticipatory guidance regarding the course of prenatal care. Reviewed red flag symptoms and when to call. RTC x 1 week for ROB with ANNIE or sooner if needed.

## 2020-07-27 LAB — GC/CHLAMYDIA PROBE AMP
Chlamydia trachomatis, NAA: NEGATIVE
Neisseria Gonorrhoeae by PCR: NEGATIVE

## 2020-07-27 LAB — STREP GP B NAA: Strep Gp B NAA: NEGATIVE

## 2020-08-05 ENCOUNTER — Other Ambulatory Visit: Payer: Self-pay

## 2020-08-05 ENCOUNTER — Ambulatory Visit (INDEPENDENT_AMBULATORY_CARE_PROVIDER_SITE_OTHER): Payer: BC Managed Care – PPO | Admitting: Certified Nurse Midwife

## 2020-08-05 ENCOUNTER — Encounter: Payer: Self-pay | Admitting: Certified Nurse Midwife

## 2020-08-05 VITALS — BP 120/90 | HR 103 | Wt 228.8 lb

## 2020-08-05 DIAGNOSIS — Z3A37 37 weeks gestation of pregnancy: Secondary | ICD-10-CM

## 2020-08-05 DIAGNOSIS — Z3403 Encounter for supervision of normal first pregnancy, third trimester: Secondary | ICD-10-CM

## 2020-08-05 LAB — POCT URINALYSIS DIPSTICK OB
Bilirubin, UA: NEGATIVE
Blood, UA: NEGATIVE
Glucose, UA: NEGATIVE
Ketones, UA: NEGATIVE
Nitrite, UA: NEGATIVE
POC,PROTEIN,UA: NEGATIVE
Spec Grav, UA: 1.02 (ref 1.010–1.025)
Urobilinogen, UA: 0.2 E.U./dL
pH, UA: 6.5 (ref 5.0–8.0)

## 2020-08-05 NOTE — Progress Notes (Unsigned)
ROB: Doing well. No concerns today. 

## 2020-08-05 NOTE — Progress Notes (Signed)
ROB doing well. Labor precautions reviewed. SVE -per pt request 3/70/-2 vertex. Pt forgot her log. States her BS fasting 90's and her 2 hr PP are 120-125. Bp today elevated 119/94- she denies visual changes, epigastric pain , headaches. Reflexes 1+ bilaterally, negative clonus, trace edema, pt state she sometimes notices it in her hands. Repeat BP: 120/90, PRE e labs ordered. Signs and symptoms reviewed. Will follow up with results. Notify practice with any changes .  Follow up 1 wk with Martha Finley or sooner.   Doreene Burke, CNM

## 2020-08-05 NOTE — Patient Instructions (Signed)

## 2020-08-06 LAB — COMPREHENSIVE METABOLIC PANEL
ALT: 8 IU/L (ref 0–32)
AST: 8 IU/L (ref 0–40)
Albumin/Globulin Ratio: 1.1 — ABNORMAL LOW (ref 1.2–2.2)
Albumin: 3.3 g/dL — ABNORMAL LOW (ref 3.9–5.0)
Alkaline Phosphatase: 168 IU/L — ABNORMAL HIGH (ref 44–121)
BUN/Creatinine Ratio: 16 (ref 9–23)
BUN: 7 mg/dL (ref 6–20)
Bilirubin Total: <0.2 mg/dL (ref 0.0–1.2)
CO2: 18 mmol/L — ABNORMAL LOW (ref 20–29)
Calcium: 9.3 mg/dL (ref 8.7–10.2)
Chloride: 108 mmol/L — ABNORMAL HIGH (ref 96–106)
Creatinine, Ser: 0.43 mg/dL — ABNORMAL LOW (ref 0.57–1.00)
GFR calc Af Amer: 162 mL/min/{1.73_m2} (ref >59)
GFR calc non Af Amer: 141 mL/min/{1.73_m2} (ref >59)
Globulin, Total: 3 g/dL (ref 1.5–4.5)
Glucose: 99 mg/dL (ref 65–99)
Potassium: 4.4 mmol/L (ref 3.5–5.2)
Sodium: 141 mmol/L (ref 134–144)
Total Protein: 6.3 g/dL (ref 6.0–8.5)

## 2020-08-06 LAB — CBC WITH DIFFERENTIAL/PLATELET
Basophils Absolute: 0 10*3/uL (ref 0.0–0.2)
Basos: 0 %
EOS (ABSOLUTE): 0.1 10*3/uL (ref 0.0–0.4)
Eos: 1 %
Hematocrit: 36.2 % (ref 34.0–46.6)
Hemoglobin: 12 g/dL (ref 11.1–15.9)
Immature Grans (Abs): 0 10*3/uL (ref 0.0–0.1)
Immature Granulocytes: 0 %
Lymphocytes Absolute: 2.1 10*3/uL (ref 0.7–3.1)
Lymphs: 20 %
MCH: 28.3 pg (ref 26.6–33.0)
MCHC: 33.1 g/dL (ref 31.5–35.7)
MCV: 85 fL (ref 79–97)
Monocytes Absolute: 1.1 10*3/uL — ABNORMAL HIGH (ref 0.1–0.9)
Monocytes: 10 %
Neutrophils Absolute: 7.2 10*3/uL — ABNORMAL HIGH (ref 1.4–7.0)
Neutrophils: 69 %
Platelets: 224 10*3/uL (ref 150–450)
RBC: 4.24 x10E6/uL (ref 3.77–5.28)
RDW: 12.8 % (ref 11.7–15.4)
WBC: 10.5 10*3/uL (ref 3.4–10.8)

## 2020-08-06 LAB — PROTEIN / CREATININE RATIO, URINE
Creatinine, Urine: 78.5 mg/dL
Protein, Ur: 14.3 mg/dL
Protein/Creat Ratio: 182 mg/g creat (ref 0–200)

## 2020-08-13 ENCOUNTER — Telehealth: Payer: Self-pay

## 2020-08-13 ENCOUNTER — Other Ambulatory Visit: Payer: Self-pay

## 2020-08-13 ENCOUNTER — Encounter: Payer: Self-pay | Admitting: Certified Nurse Midwife

## 2020-08-13 ENCOUNTER — Ambulatory Visit (INDEPENDENT_AMBULATORY_CARE_PROVIDER_SITE_OTHER): Payer: BC Managed Care – PPO | Admitting: Certified Nurse Midwife

## 2020-08-13 VITALS — BP 120/83 | HR 92 | Wt 227.6 lb

## 2020-08-13 DIAGNOSIS — Z3A39 39 weeks gestation of pregnancy: Secondary | ICD-10-CM

## 2020-08-13 DIAGNOSIS — Z3403 Encounter for supervision of normal first pregnancy, third trimester: Secondary | ICD-10-CM

## 2020-08-13 NOTE — Progress Notes (Signed)
ROB doing well. Pt reports vaginal cramps and pressure. Requesting to be seen today for vaginal exam. She denies SROM, vaginal bleeding, and regular ctx. SVE3-4 cm/80/-2. Labor precautions reviewed. Follow up as scheduled or PRN.   Doreene Burke, CNM

## 2020-08-13 NOTE — Progress Notes (Signed)
W/I- OB severe cramps x 12 h.

## 2020-08-13 NOTE — Patient Instructions (Signed)

## 2020-08-14 ENCOUNTER — Encounter: Payer: Self-pay | Admitting: Obstetrics and Gynecology

## 2020-08-14 ENCOUNTER — Other Ambulatory Visit: Payer: Self-pay

## 2020-08-14 ENCOUNTER — Inpatient Hospital Stay: Payer: BC Managed Care – PPO | Admitting: Anesthesiology

## 2020-08-14 ENCOUNTER — Inpatient Hospital Stay
Admission: EM | Admit: 2020-08-14 | Discharge: 2020-08-15 | DRG: 807 | Disposition: A | Discharge status: Home / Self Care | Payer: BC Managed Care – PPO | Attending: Certified Nurse Midwife | Admitting: Certified Nurse Midwife

## 2020-08-14 DIAGNOSIS — O24410 Gestational diabetes mellitus in pregnancy, diet controlled: Secondary | ICD-10-CM | POA: Diagnosis present

## 2020-08-14 DIAGNOSIS — Z3A39 39 weeks gestation of pregnancy: Secondary | ICD-10-CM

## 2020-08-14 DIAGNOSIS — O24420 Gestational diabetes mellitus in childbirth, diet controlled: Secondary | ICD-10-CM | POA: Diagnosis present

## 2020-08-14 DIAGNOSIS — Z87891 Personal history of nicotine dependence: Secondary | ICD-10-CM

## 2020-08-14 DIAGNOSIS — Z20822 Contact with and (suspected) exposure to covid-19: Secondary | ICD-10-CM | POA: Diagnosis present

## 2020-08-14 DIAGNOSIS — O26893 Other specified pregnancy related conditions, third trimester: Secondary | ICD-10-CM | POA: Diagnosis present

## 2020-08-14 LAB — CBC
HCT: 36.3 % (ref 36.0–46.0)
Hemoglobin: 12.6 g/dL (ref 12.0–15.0)
MCH: 29.4 pg (ref 26.0–34.0)
MCHC: 34.7 g/dL (ref 30.0–36.0)
MCV: 84.8 fL (ref 80.0–100.0)
Platelets: 213 10*3/uL (ref 150–400)
RBC: 4.28 MIL/uL (ref 3.87–5.11)
RDW: 13.7 % (ref 11.5–15.5)
WBC: 15.6 10*3/uL — ABNORMAL HIGH (ref 4.0–10.5)
nRBC: 0 % (ref 0.0–0.2)

## 2020-08-14 LAB — URINE DRUG SCREEN, QUALITATIVE (ARMC ONLY)
Amphetamines, Ur Screen: NOT DETECTED
Barbiturates, Ur Screen: NOT DETECTED
Benzodiazepine, Ur Scrn: NOT DETECTED
Cannabinoid 50 Ng, Ur ~~LOC~~: NOT DETECTED
Cocaine Metabolite,Ur ~~LOC~~: NOT DETECTED
MDMA (Ecstasy)Ur Screen: NOT DETECTED
Methadone Scn, Ur: NOT DETECTED
Opiate, Ur Screen: NOT DETECTED
Phencyclidine (PCP) Ur S: NOT DETECTED
Tricyclic, Ur Screen: NOT DETECTED

## 2020-08-14 LAB — COMPREHENSIVE METABOLIC PANEL
ALT: 11 U/L (ref 0–44)
AST: 16 U/L (ref 15–41)
Albumin: 3 g/dL — ABNORMAL LOW (ref 3.5–5.0)
Alkaline Phosphatase: 170 U/L — ABNORMAL HIGH (ref 38–126)
Anion gap: 8 (ref 5–15)
BUN: 12 mg/dL (ref 6–20)
CO2: 18 mmol/L — ABNORMAL LOW (ref 22–32)
Calcium: 9.4 mg/dL (ref 8.9–10.3)
Chloride: 107 mmol/L (ref 98–111)
Creatinine, Ser: 0.63 mg/dL (ref 0.44–1.00)
GFR, Estimated: >60 mL/min (ref >60)
Glucose, Bld: 99 mg/dL (ref 70–99)
Potassium: 4 mmol/L (ref 3.5–5.1)
Sodium: 133 mmol/L — ABNORMAL LOW (ref 135–145)
Total Bilirubin: 0.6 mg/dL (ref 0.3–1.2)
Total Protein: 7.3 g/dL (ref 6.5–8.1)

## 2020-08-14 LAB — TYPE AND SCREEN
ABO/RH(D): A POS
Antibody Screen: NEGATIVE

## 2020-08-14 LAB — RPR: RPR Ser Ql: NONREACTIVE

## 2020-08-14 LAB — PROTEIN / CREATININE RATIO, URINE
Creatinine, Urine: 66 mg/dL
Protein Creatinine Ratio: 0.11 mg/mg{Cre} (ref 0.00–0.15)
Total Protein, Urine: 7 mg/dL

## 2020-08-14 LAB — RESP PANEL BY RT-PCR (FLU A&B, COVID) ARPGX2
Influenza A by PCR: NEGATIVE
Influenza B by PCR: NEGATIVE
SARS Coronavirus 2 by RT PCR: NEGATIVE

## 2020-08-14 MED ORDER — MISOPROSTOL 200 MCG PO TABS
ORAL_TABLET | ORAL | Status: AC
  Filled 2020-08-14: qty 4

## 2020-08-14 MED ORDER — OXYCODONE-ACETAMINOPHEN 5-325 MG PO TABS: 1.0000 | ORAL_TABLET | ORAL | Status: DC | PRN

## 2020-08-14 MED ORDER — AMMONIA AROMATIC IN INHA
RESPIRATORY_TRACT | Status: AC
  Filled 2020-08-14: qty 10

## 2020-08-14 MED ORDER — TERBUTALINE SULFATE 1 MG/ML IJ SOLN: 0.2500 mg | Freq: Once | INTRAMUSCULAR | Status: DC | PRN

## 2020-08-14 MED ORDER — LIDOCAINE-EPINEPHRINE (PF) 1.5 %-1:200000 IJ SOLN
INTRAMUSCULAR | Status: DC | PRN
  Administered 2020-08-14: 3 mL via EPIDURAL

## 2020-08-14 MED ORDER — PHENYLEPHRINE 40 MCG/ML (10ML) SYRINGE FOR IV PUSH (FOR BLOOD PRESSURE SUPPORT)
80.0000 ug | PREFILLED_SYRINGE | INTRAVENOUS | Status: DC | PRN
  Filled 2020-08-14: qty 10

## 2020-08-14 MED ORDER — EPHEDRINE 5 MG/ML INJ
10.0000 mg | INTRAVENOUS | Status: DC | PRN
  Filled 2020-08-14: qty 2

## 2020-08-14 MED ORDER — OXYTOCIN-SODIUM CHLORIDE 30-0.9 UT/500ML-% IV SOLN
INTRAVENOUS | Status: AC
  Filled 2020-08-14: qty 500

## 2020-08-14 MED ORDER — PRENATAL MULTIVITAMIN CH
1.0000 | ORAL_TABLET | Freq: Every day | ORAL | Status: DC
  Administered 2020-08-14 – 2020-08-15 (×2): 1 via ORAL
  Filled 2020-08-14 (×2): qty 1

## 2020-08-14 MED ORDER — METHYLERGONOVINE MALEATE 0.2 MG PO TABS: 0.2000 mg | ORAL_TABLET | ORAL | Status: DC | PRN

## 2020-08-14 MED ORDER — IBUPROFEN 600 MG PO TABS
600.0000 mg | ORAL_TABLET | Freq: Four times a day (QID) | ORAL | Status: DC
  Administered 2020-08-14 – 2020-08-15 (×5): 600 mg via ORAL
  Filled 2020-08-14 (×5): qty 1

## 2020-08-14 MED ORDER — WITCH HAZEL-GLYCERIN EX PADS
1.0000 "application " | MEDICATED_PAD | CUTANEOUS | Status: DC | PRN
  Administered 2020-08-14: 1 via TOPICAL
  Filled 2020-08-14: qty 100

## 2020-08-14 MED ORDER — BUTORPHANOL TARTRATE 1 MG/ML IJ SOLN: 1.0000 mg | INTRAMUSCULAR | Status: DC | PRN

## 2020-08-14 MED ORDER — BENZOCAINE-MENTHOL 20-0.5 % EX AERO
1.0000 "application " | INHALATION_SPRAY | CUTANEOUS | Status: DC | PRN
  Administered 2020-08-14: 1 via TOPICAL
  Filled 2020-08-14: qty 56

## 2020-08-14 MED ORDER — ONDANSETRON HCL 4 MG/2ML IJ SOLN: 4.0000 mg | Freq: Four times a day (QID) | INTRAMUSCULAR | Status: DC | PRN

## 2020-08-14 MED ORDER — SOD CITRATE-CITRIC ACID 500-334 MG/5ML PO SOLN: 30.0000 mL | ORAL | Status: DC | PRN

## 2020-08-14 MED ORDER — LIDOCAINE HCL (PF) 1 % IJ SOLN: 30.0000 mL | INTRAMUSCULAR | Status: DC | PRN

## 2020-08-14 MED ORDER — COCONUT OIL OIL
1.0000 "application " | TOPICAL_OIL | Status: DC | PRN
  Administered 2020-08-14: 1 via TOPICAL
  Filled 2020-08-14: qty 120

## 2020-08-14 MED ORDER — OXYCODONE-ACETAMINOPHEN 5-325 MG PO TABS: 2.0000 | ORAL_TABLET | ORAL | Status: DC | PRN

## 2020-08-14 MED ORDER — ONDANSETRON HCL 4 MG PO TABS: 4.0000 mg | ORAL_TABLET | ORAL | Status: DC | PRN

## 2020-08-14 MED ORDER — FERROUS SULFATE 325 (65 FE) MG PO TABS
325.0000 mg | ORAL_TABLET | Freq: Every day | ORAL | Status: DC
  Administered 2020-08-15: 325 mg via ORAL
  Filled 2020-08-14: qty 1

## 2020-08-14 MED ORDER — OXYTOCIN BOLUS FROM INFUSION: 333.0000 mL | Freq: Once | INTRAVENOUS | Status: DC

## 2020-08-14 MED ORDER — LACTATED RINGERS IV SOLN: INTRAVENOUS | Status: DC

## 2020-08-14 MED ORDER — FENTANYL 2.5 MCG/ML W/ROPIVACAINE 0.15% IN NS 100 ML EPIDURAL (ARMC)
12.0000 mL/h | EPIDURAL | Status: DC
  Administered 2020-08-14: 12 mL/h via EPIDURAL

## 2020-08-14 MED ORDER — LACTATED RINGERS IV SOLN: 500.0000 mL | INTRAVENOUS | Status: DC | PRN

## 2020-08-14 MED ORDER — OXYTOCIN-SODIUM CHLORIDE 30-0.9 UT/500ML-% IV SOLN
1.0000 m[IU]/min | INTRAVENOUS | Status: DC
  Administered 2020-08-14: 4 m[IU]/min via INTRAVENOUS

## 2020-08-14 MED ORDER — ONDANSETRON HCL 4 MG/2ML IJ SOLN: 4.0000 mg | INTRAMUSCULAR | Status: DC | PRN

## 2020-08-14 MED ORDER — DOCUSATE SODIUM 100 MG PO CAPS
100.0000 mg | ORAL_CAPSULE | Freq: Two times a day (BID) | ORAL | Status: DC
  Administered 2020-08-15: 100 mg via ORAL
  Filled 2020-08-14: qty 1

## 2020-08-14 MED ORDER — ACETAMINOPHEN 325 MG PO TABS
650.0000 mg | ORAL_TABLET | ORAL | Status: DC | PRN
  Administered 2020-08-14: 650 mg via ORAL

## 2020-08-14 MED ORDER — SIMETHICONE 80 MG PO CHEW: 80.0000 mg | CHEWABLE_TABLET | ORAL | Status: DC | PRN

## 2020-08-14 MED ORDER — LIDOCAINE HCL (PF) 1 % IJ SOLN
INTRAMUSCULAR | Status: AC
  Filled 2020-08-14: qty 30

## 2020-08-14 MED ORDER — SENNOSIDES-DOCUSATE SODIUM 8.6-50 MG PO TABS
2.0000 | ORAL_TABLET | ORAL | Status: DC
  Administered 2020-08-14 – 2020-08-15 (×2): 2 via ORAL
  Filled 2020-08-14 (×2): qty 2

## 2020-08-14 MED ORDER — DIBUCAINE (PERIANAL) 1 % EX OINT
1.0000 "application " | TOPICAL_OINTMENT | CUTANEOUS | Status: DC | PRN
  Administered 2020-08-14: 1 via RECTAL
  Filled 2020-08-14: qty 28

## 2020-08-14 MED ORDER — SODIUM CHLORIDE 0.9 % IV SOLN
INTRAVENOUS | Status: DC | PRN
  Administered 2020-08-14 (×2): 5 mL via EPIDURAL

## 2020-08-14 MED ORDER — ACETAMINOPHEN 325 MG PO TABS
650.0000 mg | ORAL_TABLET | ORAL | Status: DC | PRN
  Filled 2020-08-14: qty 2

## 2020-08-14 MED ORDER — LIDOCAINE HCL (PF) 1 % IJ SOLN
INTRAMUSCULAR | Status: DC | PRN
  Administered 2020-08-14: 1 mL via SUBCUTANEOUS

## 2020-08-14 MED ORDER — OXYTOCIN-SODIUM CHLORIDE 30-0.9 UT/500ML-% IV SOLN: 2.5000 [IU]/h | INTRAVENOUS | Status: DC

## 2020-08-14 MED ORDER — METHYLERGONOVINE MALEATE 0.2 MG/ML IJ SOLN: 0.2000 mg | INTRAMUSCULAR | Status: DC | PRN

## 2020-08-14 MED ORDER — LACTATED RINGERS IV SOLN: 500.0000 mL | Freq: Once | INTRAVENOUS | Status: DC

## 2020-08-14 MED ORDER — OXYTOCIN 10 UNIT/ML IJ SOLN
INTRAMUSCULAR | Status: AC
  Administered 2020-08-14: 10 [IU]
  Filled 2020-08-14: qty 2

## 2020-08-14 MED ORDER — DIPHENHYDRAMINE HCL 50 MG/ML IJ SOLN
12.5000 mg | INTRAMUSCULAR | Status: DC | PRN
Start: 2020-08-14 — End: 2020-08-14

## 2020-08-14 MED ORDER — FENTANYL 2.5 MCG/ML W/ROPIVACAINE 0.15% IN NS 100 ML EPIDURAL (ARMC)
EPIDURAL | Status: AC
  Filled 2020-08-14: qty 100

## 2020-08-14 NOTE — H&P (Signed)
History and Physical   HPI  Martha Finley is a 25 y.o. G1P0000 at [redacted]w[redacted]d Estimated Date of Delivery: 08/20/20 who is being admitted for labor management   OB History  OB History  Gravida Para Term Preterm AB Living  1 0 0 0 0 0  SAB IAB Ectopic Multiple Live Births  0 0 0 0 0    # Outcome Date GA Lbr Len/2nd Weight Sex Delivery Anes PTL Lv  1 Current             PROBLEM LIST  Pregnancy complications or risks: Patient Active Problem List   Diagnosis Date Noted  . Labor and delivery, indication for care 08/14/2020  . Type A blood, Rh positive 07/26/2020    Prenatal labs and studies: ABO, Rh: --/--/A POS (02/23 0302) Antibody: NEG (02/23 0302) Rubella: 1.79 (08/05 1539) RPR: Non Reactive (12/08 0940)  HBsAg: Negative (08/05 1539)  HIV: Non Reactive (08/05 1539)  JSE:GBTDVVOH/-- (02/03 1611)   Past Medical History:  Diagnosis Date  . Gestational diabetes   . Known health problems: none      Past Surgical History:  Procedure Laterality Date  . NO PAST SURGERIES    . WISDOM TOOTH EXTRACTION       Medications    Current Discharge Medication List    CONTINUE these medications which have NOT CHANGED   Details  Bacillus Coagulans-Inulin (PROBIOTIC) 1-250 BILLION-MG CAPS Take 1 capsule by mouth daily.    folic acid (FOLVITE) 1 MG tablet Take 800 mcg by mouth daily.     glucose blood test strip Use as instructed Qty: 100 each, Refills: 12    Microlet Lancets MISC 4 Devices by Does not apply route in the morning, at noon, in the evening, and at bedtime. Qty: 100 each, Refills: 12    Prenatal Vit-Fe Fumarate-FA (PRENATAL MULTIVITAMIN) TABS tablet Take 1 tablet by mouth daily at 12 noon.         Allergies  Patient has no known allergies.  Review of Systems  Constitutional: negative Eyes: negative Ears, nose, mouth, throat, and face: negative Respiratory: negative Cardiovascular: negative Gastrointestinal:  negative Genitourinary:negative Integument/breast: negative Hematologic/lymphatic: negative Musculoskeletal:negative Neurological: negative Behavioral/Psych: negative Endocrine: negative Allergic/Immunologic: negative  Physical Exam  BP 126/79   Pulse (!) 102   Temp 98.4 F (36.9 C) (Oral)   Resp 16   Ht 5\' 6"  (1.676 m)   Wt 102.1 kg   LMP 10/28/2019 (Exact Date)   SpO2 97%   BMI 36.32 kg/m   Lungs:  CTA B Cardio: RRR Abd: Soft, gravid, NT Presentation: cephalic EXT: No C/C/ 1+ Edema DTRs: 2+ B  CERVIX: Dilation: 6 Effacement (%): 90 Cervical Position: Middle Station: -1 Presentation: Vertex Exam by:: [email or web address] RN  See Prenatal records for more detailed PE.     FHR:  Baseline: 130 bpm, Variability: Good {> 6 bpm), Accelerations: Reactive and Decelerations: Early  Toco: Uterine Contractions: difficult to assess due to maternal position and body habitus. IUPC placed.  Test Results  Results for orders placed or performed during the hospital encounter of 08/14/20 (from the past 24 hour(s))  Resp Panel by RT-PCR (Flu A&B, Covid) Nasopharyngeal Swab     Status: None   Collection Time: 08/14/20  2:33 AM   Specimen: Nasopharyngeal Swab; Nasopharyngeal(NP) swabs in vial transport medium  Result Value Ref Range   SARS Coronavirus 2 by RT PCR NEGATIVE NEGATIVE   Influenza A by PCR NEGATIVE NEGATIVE   Influenza B  by PCR NEGATIVE NEGATIVE  Protein / creatinine ratio, urine     Status: None   Collection Time: 08/14/20  2:33 AM  Result Value Ref Range   Creatinine, Urine 66 mg/dL   Total Protein, Urine 7 mg/dL   Protein Creatinine Ratio 0.11 0.00 - 0.15 mg/mg[Cre]  Urine Drug Screen, Qualitative ([facility] only)     Status: None   Collection Time: 08/14/20  2:33 AM  Result Value Ref Range   Tricyclic, Ur Screen NONE DETECTED NONE DETECTED   Amphetamines, Ur Screen NONE DETECTED NONE DETECTED   MDMA (Ecstasy)Ur Screen NONE DETECTED NONE DETECTED   Cocaine  Metabolite,Ur [place] NONE DETECTED NONE DETECTED   Opiate, Ur Screen NONE DETECTED NONE DETECTED   Phencyclidine (PCP) Ur S NONE DETECTED NONE DETECTED   Cannabinoid 50 Ng, Ur [place] NONE DETECTED NONE DETECTED   Barbiturates, Ur Screen NONE DETECTED NONE DETECTED   Benzodiazepine, Ur Scrn NONE DETECTED NONE DETECTED   Methadone Scn, Ur NONE DETECTED NONE DETECTED  Type and screen [facility]     Status: None   Collection Time: 08/14/20  3:02 AM  Result Value Ref Range   ABO/RH(D) A POS    Antibody Screen NEG    Sample Expiration      08/17/2020,2359 Performed at [facility], [address] [place], [place] [address]   Comprehensive metabolic panel     Status: Abnormal   Collection Time: 08/14/20  3:02 AM  Result Value Ref Range   Sodium 133 (L) 135 - 145 mmol/L   Potassium 4.0 3.5 - 5.1 mmol/L   Chloride 107 98 - 111 mmol/L   CO2 18 (L) 22 - 32 mmol/L   Glucose, Bld 99 70 - 99 mg/dL   BUN 12 6 - 20 mg/dL   Creatinine, Ser 4.97 0.44 - 1.00 mg/dL   Calcium 9.4 8.9 - 02.6 mg/dL   Total Protein 7.3 6.5 - 8.1 g/dL   Albumin 3.0 (L) 3.5 - 5.0 g/dL   AST 16 15 - 41 U/L   ALT 11 0 - 44 U/L   Alkaline Phosphatase 170 (H) 38 - 126 U/L   Total Bilirubin 0.6 0.3 - 1.2 mg/dL   GFR, Estimated >37 >85 mL/min   Anion gap 8 5 - 15  CBC     Status: Abnormal   Collection Time: 08/14/20  3:07 AM  Result Value Ref Range   WBC 15.6 (H) 4.0 - 10.5 K/uL   RBC 4.28 3.87 - 5.11 MIL/uL   Hemoglobin 12.6 12.0 - 15.0 g/dL   HCT 88.5 02.7 - 74.1 %   MCV 84.8 80.0 - 100.0 fL   MCH 29.4 26.0 - 34.0 pg   MCHC 34.7 30.0 - 36.0 g/dL   RDW 28.7 86.7 - 67.2 %   Platelets 213 150 - 400 K/uL   nRBC 0.0 0.0 - 0.2 %   Group B Strep negative  Assessment   G1P0000 at [redacted]w[redacted]d Estimated Date of Delivery: 08/20/20  The fetus is reassuring.   Patient Active Problem List   Diagnosis Date Noted  . Labor and delivery, indication for care 08/14/2020  . Type A blood, Rh positive  07/26/2020    Plan  1. Admitted to L&D :   2. EFM:-- Category 1 3. Stadol or Epidural if desired.   4. Admission labs completed  5. IUPC placed 6. Pitocin augmentation PRN  Doreene Burke, CNM  08/14/2020 5:41 AM

## 2020-08-14 NOTE — Progress Notes (Signed)
Evans, MD at bedside to evaluate vacuum extractor use.   KIWI vacuum  0947 Vacuum applied 0948 Traction applied 0948 pull 0950 Traction applied  0951 pull 0953 Traction applied 0953 Pull with 1 pop off  Bell Vacuum 0954 Vacuum applied 0955 Traction applied 0955 Pull 0957 Traction applied 0957 Pull with 1 pop off  Spontaneous vaginal delivery @ 0959 with nuchal x1 with A.Camila Li, MD at bedside

## 2020-08-14 NOTE — Anesthesia Preprocedure Evaluation (Signed)
Anesthesia Evaluation  Patient identified by MRN, date of birth, ID band Patient awake    Reviewed: Allergy & Precautions, H&P , NPO status , Patient's Chart, lab work & pertinent test results  History of Anesthesia Complications Negative for: history of anesthetic complications  Airway Mallampati: III  TM Distance: >3 FB Neck ROM: full    Dental  (+) Chipped   Pulmonary neg pulmonary ROS, former smoker,    Pulmonary exam normal        Cardiovascular Exercise Tolerance: Good hypertension, Normal cardiovascular exam     Neuro/Psych    GI/Hepatic negative GI ROS,   Endo/Other  diabetes, Gestational  Renal/GU   negative genitourinary   Musculoskeletal   Abdominal   Peds  Hematology negative hematology ROS (+)   Anesthesia Other Findings Past Medical History: No date: Gestational diabetes No date: Known health problems: none  Past Surgical History: No date: NO PAST SURGERIES No date: WISDOM TOOTH EXTRACTION  BMI    Body Mass Index: 36.32 kg/m      Reproductive/Obstetrics (+) Pregnancy                             Anesthesia Physical Anesthesia Plan  ASA: II  Anesthesia Plan: Epidural   Post-op Pain Management:    Induction:   PONV Risk Score and Plan:   Airway Management Planned: Natural Airway  Additional Equipment:   Intra-op Plan:   Post-operative Plan:   Informed Consent: I have reviewed the patients History and Physical, chart, labs and discussed the procedure including the risks, benefits and alternatives for the proposed anesthesia with the patient or authorized representative who has indicated his/her understanding and acceptance.     Dental Advisory Given  Plan Discussed with: Anesthesiologist  Anesthesia Plan Comments: (Patient reports no bleeding problems and no anticoagulant use.   Patient consented for risks of anesthesia including but not limited  to:  - adverse reactions to medications - risk of bleeding, infection and or nerve damage from epidural that could lead to paralysis - risk of headache or failed epidural - nerve damage due to positioning - that if epidural is used for C-section that there is a chance of epidural failure requiring spinal placement or conversion to GA - Damage to heart, brain, lungs, other parts of body or loss of life  Patient voiced understanding.)        Anesthesia Quick Evaluation

## 2020-08-14 NOTE — Anesthesia Procedure Notes (Signed)
Epidural Patient location during procedure: OB Start time: 08/14/2020 3:40 AM End time: 08/14/2020 3:45 AM  Staffing Anesthesiologist: Carrin Vannostrand, Cleda Mccreedy, MD Performed: anesthesiologist   Preanesthetic Checklist Completed: patient identified, IV checked, site marked, risks and benefits discussed, surgical consent, monitors and equipment checked, pre-op evaluation and timeout performed  Epidural Patient position: sitting Prep: ChloraPrep Patient monitoring: heart rate, continuous pulse ox and blood pressure Approach: midline Location: L3-L4 Injection technique: LOR saline  Needle:  Needle type: Tuohy  Needle gauge: 17 G Needle length: 9 cm and 9 Needle insertion depth: 7 cm Catheter type: closed end flexible Catheter size: 19 Gauge Catheter at skin depth: 12 cm Test dose: negative and 1.5% lidocaine with Epi 1:200 K  Assessment Sensory level: T10 Events: blood not aspirated, injection not painful, no injection resistance, no paresthesia and negative IV test  Additional Notes 1 attempt Pt. Evaluated and documentation done after procedure finished. Patient identified. Risks/Benefits/Options discussed with patient including but not limited to bleeding, infection, nerve damage, paralysis, failed block, incomplete pain control, headache, blood pressure changes, nausea, vomiting, reactions to medication both or allergic, itching and postpartum back pain. Confirmed with bedside nurse the patient's most recent platelet count. Confirmed with patient that they are not currently taking any anticoagulation, have any bleeding history or any family history of bleeding disorders. Patient expressed understanding and wished to proceed. All questions were answered. Sterile technique was used throughout the entire procedure. Please see nursing notes for vital signs. Test dose was given through epidural catheter and negative prior to continuing to dose epidural or start infusion. Warning signs of  high block given to the patient including shortness of breath, tingling/numbness in hands, complete motor block, or any concerning symptoms with instructions to call for help. Patient was given instructions on fall risk and not to get out of bed. All questions and concerns addressed with instructions to call with any issues or inadequate analgesia.   Patient tolerated the insertion well without immediate complications.Reason for block:procedure for pain

## 2020-08-14 NOTE — Progress Notes (Signed)
LABOR NOTE   Martha Finley 24 y.o.@ at [redacted]w[redacted]d  SUBJECTIVE:  Pt pushing effort decreasing due to maternal exhaustion's. Discussed assistance with vacuum pt verbalizes and agree to plan.   Analgesia: Epidural  OBJECTIVE:  BP 133/84   Pulse (!) 103   Temp 98.2 F (36.8 C) (Oral)   Resp 18   Ht 5\' 6"  (1.676 m)   Wt 102.1 kg   LMP 10/28/2019 (Exact Date)   SpO2 96%   BMI 36.32 kg/m  Total I/O In: -  Out: 350 [Urine:350]  She has shown cervical change. CERVIX: Complete plus 1 to plus 2 station.  SVE:   Dilation: 10 Effacement (%): 100 Station: -1 Exam by:: [email or web address] CONTRACTIONS: irregular, every 1-3 minutes FHR: Fetal heart tracing reviewed. Baseline: 150 bpm, Variability: Good {> 6 bpm), Accelerations: Reactive and Decelerations: variable with pushing Category II   Labs: Lab Results  Component Value Date   WBC 15.6 (H) 08/14/2020   HGB 12.6 08/14/2020   HCT 36.3 08/14/2020   MCV 84.8 08/14/2020   PLT 213 08/14/2020    ASSESSMENT: 1) Labor curve reviewed.       Progress: Active phase labor.     Membranes: ruptured, clear fluid     IUPC      2) Dr.Evans at bedside 0943 to evaluate for assisted vacuum delivery  Active Problems:   Labor and delivery, indication for care   PLAN: continue present management   08/16/2020, CNM  08/14/2020 10:32 AM

## 2020-08-14 NOTE — Progress Notes (Signed)
Martha Finley is stable after vaginal delivery.Pt's support people [place] and [place] are  at bedside. Pt is bonding with infant and performing skin to skin after delivery. Epidural catheter removed by RN, tip intact, no bleeding noted at site. Pt is stable and ambulatory. Pt ambulated to the bathroom and voided. Pt tolerated activity. Pt transferred to mother/baby unit, RM 346. Report given to Vergia Alberts, RN

## 2020-08-14 NOTE — Progress Notes (Signed)
LABOR NOTE   Martha Finley 25 y.o.@ at [redacted]w[redacted]d  SUBJECTIVE:  Pushing , pt denies pressure or urge to push  Analgesia: Epidural  OBJECTIVE:  BP 117/65   Pulse 84   Temp 98.3 F (36.8 C) (Oral)   Resp 16   Ht 5\' 6"  (1.676 m)   Wt 102.1 kg   LMP 10/28/2019 (Exact Date)   SpO2 99%   BMI 36.32 kg/m  No intake/output data recorded.  She has shown cervical change. CERVIX: 10:   SVE:   Dilation: 10 Effacement (%): 100 Station: -1 Exam by:: [email or web address] CONTRACTIONS: regular, every 2 minutes FHR: Fetal heart tracing reviewed. Baseline: 160 bpm, Variability: Good {> 6 bpm), Accelerations: Non-reactive but appropriate for gestational age and Decelerations: early, occsional late , and variable decels with pushing Category II   Labs: Lab Results  Component Value Date   WBC 15.6 (H) 08/14/2020   HGB 12.6 08/14/2020   HCT 36.3 08/14/2020   MCV 84.8 08/14/2020   PLT 213 08/14/2020    ASSESSMENT: 1) Labor curve reviewed.       Progress: Active phase labor.     Membranes: ruptured, clear fluid     IUPC in place     Pitocin augmentation         Active Problems:   Labor and delivery, indication for care   PLAN: continue present management Pt rotated to right and left lateral with pushing for several ctx, walters manuver completed to encourage fetal descent with minimal change. Given pt has no urge to push epidural rate decreased. Dr. 08/16/2020 notified of pt progress.    Valentino Saxon, CNM 08/14/2020 7:53 AM

## 2020-08-14 NOTE — Progress Notes (Signed)
Pt momentarily stopped pushing and placed in walchers position (spinning babies- supine and foot of the bed dropped down). A.Thompson,CNM at bedside and reviewed strip.

## 2020-08-14 NOTE — OB Triage Note (Signed)
Pt presents c/o ctx that started around 10:30 last night. Pt states ctx are about 5 minutes apart. Pt rates ctx pain 6/10 in her lower abdomen. Pt denies bleeding or LOF. Reports positive fetal movement. Pt initial BP was slightly elevated at 140/87. Pt denies having HTN this pregnancy. BP cycling q 15 mins. Pt denies HA, blurry vision, or epigastric pain. +2 reflexes. 1 beat of clonus noted on the right side. Fetal monitoring applied. Will continue to monitor.

## 2020-08-14 NOTE — Lactation Note (Signed)
This note was copied from a baby's chart. Lactation Consultation Note  Patient Name: Martha Finley NIOEV'O Date: 08/14/2020 Reason for consult: Follow-up assessment;1st time breastfeeding;Term;Other (Comment) (Mom GDM) Age:27 hours  Lactation to bedside. Baby completed second glucose check at 46. Attempted a feeding, but baby sleepy and uninterested at this time. LC did hand express for about 5-7 minutes from mom's left breast- obtained 2.12mL; 58mL given via 89mL syringe and finger feed. Educated mom on the reason for this prior to feed; consent given. Baby tolerated the feed well. Education given to parents for feeding with early cues, reviewed cues, continued skin to skin encouraged, hand expression if baby doesn't wake to feed, and 8-12 feeding attempts in first 24 hours. Encouraged to call for support overnight as needed for breastfeeding.  Maternal Data Has patient been taught Hand Expression?: Yes Does the patient have breastfeeding experience prior to this delivery?: No  Feeding Mother's Current Feeding Choice: Breast Milk  LATCH Score Latch: Too sleepy or reluctant, no latch achieved, no sucking elicited. (sleepy)                  Lactation Tools Discussed/Used    Interventions Interventions: Breast feeding basics reviewed;Hand express;Skin to skin;Adjust position;Support pillows;Position options;Education  Discharge    Consult Status Consult Status: Follow-up Date: 08/15/20 Follow-up type: In-patient    [name] 08/14/2020, 3:48 PM

## 2020-08-14 NOTE — Progress Notes (Signed)
LABOR NOTE   Martha Finley 24 y.o.@ at [redacted]w[redacted]d  SUBJECTIVE:  Analgesia: Epidural  OBJECTIVE:  BP 117/65   Pulse 84   Temp 98.3 F (36.8 C) (Oral)   Resp 16   Ht 5\' 6"  (1.676 m)   Wt 102.1 kg   LMP 10/28/2019 (Exact Date)   SpO2 99%   BMI 36.32 kg/m  Total I/O In: 809.7 [I.V.:785.1; Other:24.6] Out: -   She has shown cervical change. CERVIX: 9.5:reduces with pushing 100%:   -2:    SVE:   Dilation: 10 Effacement (%): 100 Station: -1 Exam by:: [email or web address] CONTRACTIONS: regular, every 2 minutes FHR: Fetal heart tracing reviewed. Baseline: 130 bpm, Variability: Good {> 6 bpm), Accelerations: Reactive and Decelerations: Variable: mild Category II   Labs: Lab Results  Component Value Date   WBC 15.6 (H) 08/14/2020   HGB 12.6 08/14/2020   HCT 36.3 08/14/2020   MCV 84.8 08/14/2020   PLT 213 08/14/2020    ASSESSMENT: 1) Labor curve reviewed.       Progress: Active phase labor.     Membranes: ruptured, clear fluid     IUPC in place        Active Problems:   Labor and delivery, indication for care   PLAN: continue present management   08/16/2020, CNM  08/14/2020 6:45 AM

## 2020-08-14 NOTE — Progress Notes (Signed)
LABOR NOTE   Martha Finley 25 y.o.@ at [redacted]w[redacted]d  SUBJECTIVE:  Pt pushing with little movement of fetal station. Dr.Cherry at the bedside to evaluate pt progress, manual rotation of fetal head by DR. Cherry to OA. Pt tolerated well. Analgesia: Epidural  OBJECTIVE:  BP 129/75 (BP Location: Left Arm)   Pulse (!) 104   Temp 98.4 F (36.9 C) (Oral)   Resp 16   Ht 5\' 6"  (1.676 m)   Wt 102.1 kg   LMP 10/28/2019 (Exact Date)   SpO2 100%   BMI 36.32 kg/m  Total I/O In: -  Out: 350 [Urine:350]  She has shown cervical change. CERVIX: complete:   SVE:   Dilation: 10 Effacement (%): 100 Station: -1 Exam by:: [email or web address] CONTRACTIONS: regular, every 1-3 minutes FHR: Fetal heart tracing reviewed. Baseline: 170 bpm, Variability: Good {> 6 bpm), Accelerations: Non-reactive but appropriate for gestational age and Decelerations: variables with pushing  Category II    Labs: Lab Results  Component Value Date   WBC 15.6 (H) 08/14/2020   HGB 12.6 08/14/2020   HCT 36.3 08/14/2020   MCV 84.8 08/14/2020   PLT 213 08/14/2020    ASSESSMENT: 1) Labor curve reviewed.       Progress: Active phase labor.     Membranes: ruptured, clear fluid     IUPC       Active Problems:   Labor and delivery, indication for care GDM-diet controlled  PLAN: continue present management. Dr. 08/16/2020 discussed assistance with Vacuum assistance if need. I reviewed risks and benefits with the pt and her family. They verbalizes understanding. Will continue to push.    Valentino Saxon, CNM  08/14/2020 10:26 AM

## 2020-08-14 NOTE — Lactation Note (Signed)
This note was copied from a baby's chart. Lactation Consultation Note  Patient Name: Martha Finley QIWLN'L Date: 08/14/2020 Reason for consult: L&D Initial assessment;1st time breastfeeding;Term;Other (Comment) (mom GDM) Age:27 hours  Lactation initial assessment done bedside in LDR3. First time mom desires exclusive BF, can easily express colostrum, and has already successfully fed on L breast.  LC did full assist with feeding on R breast in cradle hold as mom was exhausted from over 2hr pushing during delivery. Baby can grasp the breast easily, has strong rhythmic sucking pattern and audible spontaneous swallows. LC held baby at breast and did breast massage and compression throughout feeding. LC provided brief BF basic education: stomach size, early cues, position, and sandwiching of the breast tissue. Mom had gestational diabetes that was diet controlled; baby's first glucose is pending.  LC to follow-up.  Maternal Data Has patient been taught Hand Expression?: Yes Does the patient have breastfeeding experience prior to this delivery?: No  Feeding Mother's Current Feeding Choice: Breast Milk  LATCH Score Latch: Grasps breast easily, tongue down, lips flanged, rhythmical sucking.  Audible Swallowing: Spontaneous and intermittent  Type of Nipple: Everted at rest and after stimulation  Comfort (Breast/Nipple): Soft / non-tender  Hold (Positioning): Full assist, staff holds infant at breast  LATCH Score: 8   Lactation Tools Discussed/Used    Interventions Interventions: Breast feeding basics reviewed;Assisted with latch;Breast massage;Hand express;Breast compression;Adjust position;Support pillows;Education  Discharge    Consult Status Consult Status: Follow-up Date: 08/14/20 Follow-up type: In-patient    [name] 08/14/2020, 12:05 PM

## 2020-08-15 DIAGNOSIS — O24410 Gestational diabetes mellitus in pregnancy, diet controlled: Secondary | ICD-10-CM | POA: Diagnosis present

## 2020-08-15 LAB — CBC
HCT: 32.1 % — ABNORMAL LOW (ref 36.0–46.0)
Hemoglobin: 10.5 g/dL — ABNORMAL LOW (ref 12.0–15.0)
MCH: 28.7 pg (ref 26.0–34.0)
MCHC: 32.7 g/dL (ref 30.0–36.0)
MCV: 87.7 fL (ref 80.0–100.0)
Platelets: 191 10*3/uL (ref 150–400)
RBC: 3.66 MIL/uL — ABNORMAL LOW (ref 3.87–5.11)
RDW: 13.9 % (ref 11.5–15.5)
WBC: 15.4 10*3/uL — ABNORMAL HIGH (ref 4.0–10.5)
nRBC: 0 % (ref 0.0–0.2)

## 2020-08-15 LAB — GLUCOSE, RANDOM: Glucose, Bld: 99 mg/dL (ref 70–99)

## 2020-08-15 MED ORDER — IBUPROFEN 600 MG PO TABS: 600.0000 mg | ORAL_TABLET | Freq: Four times a day (QID) | ORAL | 0 refills | Status: DC

## 2020-08-15 MED ORDER — SIMETHICONE 80 MG PO CHEW: 80.0000 mg | CHEWABLE_TABLET | ORAL | 0 refills | Status: DC | PRN

## 2020-08-15 MED ORDER — ACETAMINOPHEN 500 MG PO TABS: 1000.0000 mg | ORAL_TABLET | Freq: Four times a day (QID) | ORAL | 1 refills | Status: DC | PRN

## 2020-08-15 MED ORDER — DOCUSATE SODIUM 100 MG PO CAPS: 100.0000 mg | ORAL_CAPSULE | Freq: Two times a day (BID) | ORAL | 0 refills | Status: DC

## 2020-08-15 MED ORDER — WITCH HAZEL-GLYCERIN EX PADS: 1.0000 "application " | MEDICATED_PAD | CUTANEOUS | 12 refills | Status: DC | PRN

## 2020-08-15 NOTE — Progress Notes (Addendum)
Post Partum Day 1  Subjective:  no complaints, up ad lib, voiding, tolerating PO and + flatus  Patient in relaxing in bed in room with family support at bedside. Reports some soreness to bottom and mild nipple soreness. Patient taking motrin and has coconut oil in room.   Denies difficulty breathing, respiratory distress, chest pain, bowel movement, and leg swelling or pain.  Objective:  Blood pressure 119/77, pulse 84, temperature 98.3 F (36.8 C), temperature source Oral, resp. rate 18, height 5\' 6"  (1.676 m), weight 102.1 kg, last menstrual period 10/28/2019, SpO2 100 %, unknown if currently breastfeeding.  Physical Exam:   General: alert, cooperative, appears stated age and moderately obese Lochia: appropriate Uterine Fundus: firm Incision: n/a DVT Evaluation: No evidence of DVT seen on physical exam. Negative Homan's sign. No cords or calf tenderness. No significant calf/ankle edema.  Recent Labs    08/14/20 0307 08/15/20 0424  HGB 12.6 10.5*  HCT 36.3 32.1*    Assessment/Plan:  Discharge home, Breastfeeding and Contraception Discussed options, patient is undecided at this time.    Patient encourage to continue to use ice pads.  Rx for Motrin, tylenol, colace, and tucks pads, see orders.   Reviewed red flag symptoms and when to call.  RTC per scheduled 2 week televisit and 6 week Postpartum exam with 08/17/20, CNM or sooner if needed.     LOS: 1 day   Martha Finley, Martha Finley 08/15/2020, 12:57 PM

## 2020-08-15 NOTE — Anesthesia Postprocedure Evaluation (Signed)
Anesthesia Post Note  Patient: Martha Finley  Procedure(s) Performed: AN AD HOC LABOR EPIDURAL  Patient location during evaluation: Mother Baby Anesthesia Type: Epidural Level of consciousness: awake Pain management: pain level controlled Respiratory status: spontaneous breathing Cardiovascular status: stable Postop Assessment: no headache Anesthetic complications: no   No complications documented.   Last Vitals:  Vitals:   08/14/20 2306 08/15/20 0320  BP: 114/82 135/84  Pulse: 86 82  Resp: 18 18  Temp: 36.4 C 36.7 C  SpO2: 100% 100%    Last Pain:  Vitals:   08/15/20 0320  TempSrc: Oral  PainSc:                  Jaye Beagle

## 2020-08-15 NOTE — Lactation Note (Signed)
This note was copied from a baby's chart. Lactation Consultation Note  Patient Name: Martha Finley [id number] Date: 08/15/2020 Reason for consult: Follow-up assessment;1st time breastfeeding;Term Age:27 hours  Lactation follow-up. Baby fed overnight well with +void/stools. Mom has no pain or discomfort, and parents feel more confident with feedings.  Last feed at 0745 for 15 minutes, baby sleeping soundly. LC educated on day 2 of life BF expectations, feeding and output. Encouraged continued skin to skin, 8-12 or more feedings in next 24 hours. Mom desires to work on side-lying position at next feeding; encouraged to call for [facility] support. All questions/concerns answered.  Maternal Data Has patient been taught Hand Expression?: Yes Does the patient have breastfeeding experience prior to this delivery?: No  Feeding Mother's Current Feeding Choice: Breast Milk  LATCH Score                    Lactation Tools Discussed/Used    Interventions Interventions: Breast feeding basics reviewed;Education  Discharge    Consult Status Consult Status: PRN Date: 08/15/20 Follow-up type: Call as needed    [name] 08/15/2020, 9:26 AM

## 2020-08-15 NOTE — Progress Notes (Signed)
Patient discharged home with infant. Discharge instructions, prescriptions and follow up appointment given to and reviewed with patient. Patient verbalized understanding. Pt wheeled out with infant by auxiliary.  

## 2020-08-15 NOTE — Discharge Summary (Signed)
Obstetric Discharge Summary  Patient ID: Martha Finley MRN: [id number] DOB/AGE: 1996-03-06 25 y.o.   Date of Admission: 08/14/2020  Date of Discharge:  08/15/20  Admitting Diagnosis: Onset of Labor at [redacted]w[redacted]d  Secondary Diagnosis:   Patient Active Problem List   Diagnosis Date Noted  . Vaginal delivery 08/15/2020  . Gestational diabetes, diet controlled 08/15/2020  . Labor and delivery, indication for care 08/14/2020  . Type A blood, Rh positive 07/26/2020   Mode of Delivery: vacuum-assisted vaginal delivery     Discharge Diagnosis: No other diagnosis   Intrapartum Procedures: epidural and placement of intrauterine catheter   Post partum procedures: None  Complications: vaginal laceration, repaired   Brief Hospital Course   Martha Finley is a G1P1001 who had a vacuumed assisted vaginal birth;  for further details of this summary, please refer to the delivey summary.  Patient had an uncomplicated postpartum course.  By time of discharge on PPD#1 , her pain was controlled on oral pain medications; she had appropriate lochia and was ambulating, voiding without difficulty and tolerating regular diet.  She was deemed stable for discharge to home.    Labs: CBC Latest Ref Rng & Units 08/15/2020 08/14/2020 08/05/2020  WBC 4.0 - 10.5 K/uL 15.4(H) 15.6(H) 10.5  Hemoglobin 12.0 - 15.0 g/dL 10.5(L) 12.6 12.0  Hematocrit 36.0 - 46.0 % 32.1(L) 36.3 36.2  Platelets 150 - 400 K/uL 191 213 224   A POS  Physical exam:   Temp:  [97.6 F (36.4 C)-98.7 F (37.1 C)] 98.3 F (36.8 C) (02/24 0809) Pulse Rate:  [82-102] 84 (02/24 0809) Resp:  [16-20] 18 (02/24 0809) BP: (112-135)/(61-90) 119/77 (02/24 0809) SpO2:  [98 %-100 %] 100 % (02/24 0809)  General: alert and no distress  Lochia: appropriate  Abdomen: soft, NT  Uterine Fundus: firm  Lacerations: healing well, no significant drainage, no dehiscence, no significant erythema  Extremities: No evidence of DVT seen on physical exam. No  lower extremity edema.  Discharge Instructions: Per After Visit Summary  Activity: Advance as tolerated. Pelvic rest for 6 weeks.  Also refer to After Visit Summary  Diet: Regular  Medications: Allergies as of 08/15/2020   No Known Allergies     Medication List    TAKE these medications   acetaminophen 500 MG tablet Commonly known as: TYLENOL Take 2 tablets (1,000 mg total) by mouth every 6 (six) hours as needed for moderate pain.   docusate sodium 100 MG capsule Commonly known as: COLACE Take 1 capsule (100 mg total) by mouth 2 (two) times daily.   folic acid 1 MG tablet Commonly known as: FOLVITE Take 800 mcg by mouth daily.   glucose blood test strip Use as instructed   ibuprofen 600 MG tablet Commonly known as: ADVIL Take 1 tablet (600 mg total) by mouth every 6 (six) hours.   Microlet Lancets Misc 4 Devices by Does not apply route in the morning, at noon, in the evening, and at bedtime.   prenatal multivitamin Tabs tablet Take 1 tablet by mouth daily at 12 noon.   Probiotic 1-250 BILLION-MG Caps Take 1 capsule by mouth daily.   simethicone 80 MG chewable tablet Commonly known as: MYLICON Chew 1 tablet (80 mg total) by mouth as needed for flatulence.   witch hazel-glycerin pad Commonly known as: TUCKS Apply 1 application topically as needed for hemorrhoids.      Outpatient follow up:   Follow-up Information    Doreene Burke, CNM Follow up.   Specialties: Certified Nurse  Midwife, Radiology Why: Please follow up as scheduled Contact information: [address] [place] [address] [phone]              Postpartum contraception: Uncertain, will discuss further at postpartum visit  Discharged Condition: stable  Discharged to: home   Newborn Data:  Disposition:home with mother  Apgars: APGAR (1 MIN): 5   APGAR (5 MINS): 8    Baby Feeding: Breast   Serafina Royals, CNM  [facility], [facility] 08/15/20  1:17 PM

## 2020-08-16 ENCOUNTER — Encounter: Payer: BC Managed Care – PPO | Admitting: Certified Nurse Midwife

## 2020-08-21 NOTE — Telephone Encounter (Signed)
Error

## 2020-08-30 ENCOUNTER — Other Ambulatory Visit: Payer: Self-pay

## 2020-08-30 ENCOUNTER — Encounter: Payer: Self-pay | Admitting: Certified Nurse Midwife

## 2020-08-30 ENCOUNTER — Ambulatory Visit (INDEPENDENT_AMBULATORY_CARE_PROVIDER_SITE_OTHER): Payer: BC Managed Care – PPO | Admitting: Certified Nurse Midwife

## 2020-08-30 DIAGNOSIS — Z1331 Encounter for screening for depression: Secondary | ICD-10-CM

## 2020-08-30 NOTE — Progress Notes (Signed)
Virtual Visit via Telephone Note  I connected with Martha Finley on 08/30/20 at  1:30 PM EST by telephone and verified that I am speaking with the correct person using two identifiers.  Location:  Patient: Martha Finley (home)  Provider: Serafina Royals, CNM ([facility], [facility])   I discussed the limitations, risks, security and privacy concerns of performing an evaluation and management service by telephone and the availability of in person appointments. I also discussed with the patient that there may be a patient responsible charge related to this service. The patient expressed understanding and agreed to proceed.   History of Present Illness:  Patient is two (2) week postpartum vaginal birth at term.   Doing well overall. Pumping without difficulty. Bleeding is light. Voiding and stooling normally.    Observations/Objective:  Depression screen [facility] 2/9 08/30/2020 08/05/2020 06/25/2020  Decreased Interest 0 0 0  Down, Depressed, Hopeless 0 0 0  PHQ - 2 Score 0 0 0  Altered sleeping 0 - -  Tired, decreased energy 0 - -  Change in appetite 0 - -  Feeling bad or failure about yourself  0 - -  Trouble concentrating 0 - -  Moving slowly or fidgety/restless 0 - -  Suicidal thoughts 0 - -  PHQ-9 Score 0 - -   GAD 7 : Generalized Anxiety Score 08/30/2020  Nervous, Anxious, on Edge 0  Control/stop worrying 0  Worry too much - different things 0  Trouble relaxing 0  Restless 0  Easily annoyed or irritable 0  Afraid - awful might happen 0  Total GAD 7 Score 0     Assessment:  Postpartum care and examination  Lactating mother  Depression screening negative   Plan:  Routine postpartum education.   Reviewed red flag symptoms and when to call.   RTC for PPV as previously scheduled or sooner if needed.   Follow Up Instructions: PPV on 09/30/2020 with Doreene Burke, CNM    I discussed the assessment and treatment plan with the patient. The patient was provided an  opportunity to ask questions and all were answered. The patient agreed with the plan and demonstrated an understanding of the instructions.   The patient was advised to call back or seek an in-person evaluation if the symptoms worsen or if the condition fails to improve as anticipated.  I provided 6 minutes of non-face-to-face time during this encounter.   Serafina Royals, CNM [facility], [facility] 08/30/20 2:18 PM

## 2020-08-30 NOTE — Progress Notes (Signed)
Feeling well GAD 7 and PHQ 9 neg.

## 2020-08-30 NOTE — Patient Instructions (Signed)
How to Keep Your Breast Pump Clean Accessible version: [email or web address][email or web address] Providing breast milk is one of the best things you can do for your baby's health and development. Pumping your milk is one way to provide breast milk to your baby. Keeping the parts of your pump clean is critical, because germs can grow quickly in breast milk or breast milk residue that remains on pump parts. Following these steps can keep your breast pump clean and help protect your baby from germs. If your baby was born prematurely or has other health concerns, your baby's health care providers may have more recommendations for pumping breast milk safely. The steps outlined below are based on the available scientific literature and expert opinion on breast pump hygiene. However, more research is needed to answer some questions about how to best clean breast pump equipment. BEFORE EVERY USE Wash your hands well with soap and water for 20 seconds. Inspect and assemble clean pump kit. If your tubing is moldy, discard and replace immediately. Clean pump dials, power switch, and countertop with disinfectant wipes, especially if using a shared pump. AFTER EVERY USE Store milk safely. Cap milk collection bottle or seal milk collection bag, label with date and time, and immediately place in a refrigerator, freezer, or cooler bag with ice packs. Clean pumping area, especially if using a shared pump. Clean the dials, power switch, and countertop with disinfectant wipes. Take apart breast pump tubing and separate all parts that come in contact with breast/breast milk. Rinse breast pump parts that come into contact with breast/breast milk by holding under running water to remove remaining milk. Do not place parts in sink to rinse. Clean pump parts that come into contact with breast/breast milk as soon as possible after pumping. You can clean your pump parts in a dishwasher or by  hand in a wash basin used only for cleaning the pump kit and infant feeding items. Clean Pump Kit CLEAN BY HAND Place pump parts in a clean wash basin used only for infant feeding items. Do not place pump parts directly in the sink! Add soap and hot water to basin. Scrub items according to pump kit manufacturer's guidance. If using a brush, use a clean one that is used only to clean infant feeding items. Rinse by holding items under running water, or by submerging in fresh water in a separate basin. Air-dry thoroughly. Place pump parts, wash basin, and bottle brush on a clean, unused dish towel or paper towel in an area protected from dirt and dust. Do not use a dish towel to rub or pat items dry! Clean wash basin and bottle brush. Rinse them well and allow them to air-dry after each use. Wash them by hand or in a dishwasher at least every few days. OR CLEAN IN DISHWASHER Clean pump parts in a dishwasher, if they are dishwasher-safe. Be sure to place small items into a closed-top basket or mesh laundry bag. Add soap and, if possible, run the dishwasher using hot water and a heated drying cycle (or sanitizing setting). Remove from dishwasher with clean hands. If items are not completely dry, place items on a clean, unused dish towel or paper towel to air-dry thoroughly before storing. Do not use a dish towel to rub or pat items dry! After Cleaning FOR EXTRA PROTECTION, SANITIZE For extra germ removal, sanitize pump parts, wash basin, and bottle brush at least once daily after they have been cleaned. Items can be sanitized using steam, boiling water, or a dishwasher  with a sanitize setting. Sanitizing is especially important if your baby is less than 3 months old, was born prematurely, or has a weakened immune system due to illness or medical treatment. For detailed instructions on sanitizing your pump parts, visit [email or web address] STORE SAFELY Store  dry items safely until needed. Ensure the clean pump parts, bottle brushes, and wash basins have air-dried thoroughly before storing. Items must be completely dry to help prevent germs and mold from growing. Store dry items in a clean, protected area. Learn more about safe and healthy diapering and infant feeding habits at [employer].at. This information is not intended to replace advice given to you by your health care provider. Make sure you discuss any questions you have with your health care provider. Document Revised: 03/22/2020 Document Reviewed: 03/22/2020 Elsevier Patient Education  2021 [place].   Breast Pumping Tips There may be times when you cannot feed your baby from your breast, such as when you are at work or on a trip. Breast pumping allows you to remove milk from your breast in order to store it for later use. There are three ways to pump. You can:  Use your hand to massage and squeeze your breast (hand expression).  Use a handheld manual pump.  Use an electric pump. When you first start to pump, you may not get much milk, but after a few days your breasts should start to make more. Pumping can help stimulate your milk supply after your baby is born. It can also help maintain your milk supply when you are away from your baby. When should I pump? You can start pumping soon after your baby is born. Talk with a lactation consultant about when it is best for you to start pumping. Here are some general tips on when to pump:  When with your baby: ? Pump after breastfeeding. ? Pump from the free breast while you breastfeed.  When away from your baby: ? Pump every 2-3 hours for about 15 minutes. ? Pump both breasts at the same time if you can.  If your baby gets formula feeding, pump around the time your baby gets that feeding.  If you drank alcohol, wait 2 hours before pumping.  If you are having a procedure with anesthesia, talk to  your health care provider about when you should pump before and after. How do I prepare to pump? Make sure you are relaxed. This makes it easier to stimulate your let-down reflex, which is what makes breast milk flow. To do this:  Look at a picture of your baby or keep an article of clothing that smells like your baby close by.  Sit in a quiet, private space.  Place a warm cloth on your breast. The cloth should be a little wet.  Massage your breast and nipple.  Play relaxing music.  Picture your milk flowing.  Drink water and have a snack. What are some tips? General tips for pumping breast milk  Always wash your hands with soap and water for at least 20 seconds before pumping.  If you are not getting very much milk or pumping is uncomfortable, make adjustments to your pump or try using different types of pumps.  Drink enough fluid to keep your urine pale yellow.  Wear clothing that opens in the front or allows easy access to your breasts.  Pump breast milk directly into clean bottles or other storage containers.  Do not use any products that contain nicotine or tobacco. These products include  cigarettes, chewing tobacco, and vaping devices, such as e-cigarettes. If you need help quitting, ask your health care provider.  Get a hands-free pumping bra, if possible. This allows for a much more relaxing experience. You can buy one or make your own.   Tips for storing breast milk  Store breast milk in a clean, BPA-free container such as glass, plastic bottles, or milk storage bags.  Store breast milk in 2-4 ounce batches to reduce waste.  Do not shake breast milk. Swirl it in the container to mix any cream that floats to the top.  Label all stored milk with the date you pumped it.  Ensure that all stored breast milk is as clean as it can be.  The amount of time you can keep breast milk depends on where it is stored: ? Room temperature: 6-8 hours, best if used within 4  hours. ? Cooler with ice packs: 24 hours. ? Refrigerator: 5-8 days, best if used within 3 days. ? Freezer: 9-12 months, best if used within 6 months.  When using a refrigerator or freezer, put the milk in the back to keep it as cold as possible.  Do not use the microwave to thaw frozen milk. Use warm water.   Tips for choosing a breast pump The right pump for you will depend on your comfort and how often you will be away from your baby. When choosing a pump, consider the following:  Manual breast pumps do not need electricity to work. They are usually cheaper than electric pumps, but they can be harder to use. They may be a good choice if you are occasionally away from your baby.  Electric breast pumps are usually more expensive than manual pumps, but they can be easier to use. They can also collect more milk than manual pumps. This makes them a good choice for women who work in an office or need to be away from their baby for long periods of time.  The suction cup (flange) should be the right size. If it is the wrong size, it may cause pain and nipple damage.  Before buying a pump, find out whether your insurance covers the cost of a breast pump. Tips for maintaining a breast pump  Check your pump's manual for cleaning tips.  Avoid touching the inside of pump parts that come in contact with breast milk.  Clean the pump after each use. To do this: ? Do not put the electrical unit in water or cleaning products. Wipe down the electrical unit with a dry, soft cloth or clean paper towel. ? Wash the plastic pump parts with soap and warm water or in the dishwasher, if the parts are dishwasher safe. You do not need to clean the tubing unless it comes in contact with breast milk. Let the parts air dry. Avoid drying them with a cloth or towel. ? When the pump parts are clean and dry, put the pump back together. Then store the pump.  When used correctly, breast pump tubing does not touch the  pumped milk and does not need to be cleaned routinely.  If there are water droplets in the tubing when it comes time to pump, attach the tubing to the pump and turn on the pump. Run the pump until the tube is dry. Summary  Pumping can help stimulate your milk supply after your baby is born. It can also help maintain your milk supply when you are away from your baby.  When you are  away from your infant for several hours, pump for about 15 minutes every 2-3 hours. Pump both breasts at the same time, if you can.  The right pump for you depends on your comfort, work schedule, and how often you may be away from your baby. This information is not intended to replace advice given to you by your health care provider. Make sure you discuss any questions you have with your health care provider. Document Revised: 03/19/2020 Document Reviewed: 03/19/2020 Elsevier Patient Education  2021 [place].   Breast Pumping Tips Breast pumping is a way to get milk out of your breasts. You will then store the milk for your baby to use when you are away from home. There are three ways to pump.  You can use your hand to massage and squeeze your breast (hand expression).  You can use a hand-held machine to manually pump your milk.  You can use an electric machine to pump your milk. In the beginning you may not get much milk. After a few days, your breasts should make more. Pumping can help you start making milk after your baby is born. Pumping helps you to keep making milk when you are away from your baby. When should I pump? You can start pumping soon after your baby is born. Follow these tips:  When you are with your baby: ? Pump after you breastfeed. ? Pump from the free breast while you breastfeed.  When you are away from your baby: ? Pump every 2-3 hours for 15 minutes. ? Pump both breasts at the same time if you can.  If your baby drinks formula, pump around the time your baby gets the  formula.  If you drank alcohol, wait 2 hours before you pump.  If you are going to have surgery, ask your doctor when you should pump again. How do I get ready to pump? Try to relax. Try these things to help your milk come in:  Smell your baby's blanket or clothes.  Look at a picture or video of your baby.  Sit in a quiet, private space.  Place a cloth on your breast. The cloth should be warm and a little wet.  Massage your breast and nipple.  Play relaxing music.  Picture your milk flowing.  Drink water and eat a snack. What are some tips? General tips for pumping breast milk  Always wash your hands with soap and water for at least 20 seconds before pumping.  If you do not get much milk or if pumping hurts, try different pump settings or a different kind of pump.  Drink enough fluid so your pee (urine) is clear or pale yellow.  Wear clothing that opens in the front or is easy to take off.  Pump milk into a clean bottle or container.  Do not smoke or use any products that contain nicotine or tobacco. If you need help quitting, ask your doctor.  Try to get a hands-free pumping bra, if possible. This makes it easy to pump breast milk. You can buy one or make your own.   Tips for storing breast milk  Store breast milk in a clean, BPA-free container. These include: ? A glass or plastic bottle. ? A milk storage bag.  Store only 2-4 ounces of breast milk in each container.  Swirl the breast milk in the container. Do not shake it.  Write down the date you pumped the milk on the container.  This is how long you  can store breast milk: ? Room temperature: 6-8 hours. It is best to use the milk within 4 hours. ? Cooler with ice packs: 24 hours. ? Refrigerator: 5-8 days, if the milk is clean. It is best to use the milk within 3 days. ? Freezer: 9-12 months, if the milk is clean and stored away from the freezer door. It is best to use the milk within 6 months.  Put milk in  the back of the refrigerator or freezer.  Thaw frozen milk using warm water. Do not use the microwave.   Tips for choosing a breast pump When choosing a pump, keep the following things in mind:  Manual breast pumps do not need electricity. They cost less. They can be hard to use.  Electric breast pumps use electricity. They are more expensive. They are easier to use. They collect more milk.  The suction cup (flange) should be the right size.  Before you buy the pump, check if your insurance will pay for it. Tips for caring for a breast pump  Check the manual that came with your pump for cleaning tips.  Try not to touch the inside of pump parts.  Clean the pump after you use it. To do this: ? Wipe down the electrical part. Use a dry cloth or paper towel. Do not put this part in water or in cleaning products. ? Wash the plastic parts with soap and warm water. Or use the dishwasher if the manual says it is safe. You do not need to clean the tubing unless it touched breast milk. ? Let all the parts air dry. Avoid drying them with a cloth or towel. ? When the parts are clean and dry, put the pump back together. Then store the pump.  If there is water in the tubing when you want to pump: 1. Attach the tubing to the pump. 2. Turn on the pump to dry the tubing. 3. Turn off the pump when the tube is dry. Summary  Pumping can help you start making milk after your baby is born. It lets you keep making milk when you are away from your baby.  When you are away from your baby, pump for about 15 minutes every 2-3 hours. Pump both breasts at the same time, if you can. This information is not intended to replace advice given to you by your health care provider. Make sure you discuss any questions you have with your health care provider. Document Revised: 03/19/2020 Document Reviewed: 03/19/2020 Elsevier Patient Education  2021 [place].    Postpartum Care After Vaginal Delivery The  following information offers guidance about how to care for yourself from the time you deliver your baby to 6-12 weeks after delivery (postpartum period). If you have problems or questions, contact your health care provider for more specific instructions. Follow these instructions at home: Vaginal bleeding  It is normal to have vaginal bleeding (lochia) after delivery. Wear a sanitary pad for bleeding and discharge. ? During the first week after delivery, the amount and appearance of lochia is often similar to a menstrual period. ? Over the next few weeks, it will gradually decrease to a dry, yellow-brown discharge. ? For most women, lochia stops completely by 4-6 weeks after delivery, but can vary.  Change your sanitary pads frequently. Watch for any changes in your flow, such as: ? A sudden increase in volume. ? A change in color. ? Large blood clots.  If you pass a blood clot from  your vagina, save it and call your health care provider. Do not flush blood clots down the toilet before talking with your health care provider.  Do not use tampons or douches until your health care provider approves.  If you are not breastfeeding, your period should return 6-8 weeks after delivery. If you are feeding your baby breast milk only, your period may not return until you stop breastfeeding. Perineal care  Keep the area between the vagina and the anus (perineum) clean and dry. Use medicated pads and pain-relieving sprays and creams as directed.  If you had a surgical cut in the perineum (episiotomy) or a tear, check the area for signs of infection until you are healed. Check for: ? More redness, swelling, or pain. ? Fluid or blood coming from the cut or tear. ? Warmth. ? Pus or a bad smell.  You may be given a squirt bottle to use instead of wiping to clean the perineum area after you use the bathroom. Pat the area gently to dry it.  To relieve pain caused by an episiotomy, a tear, or swollen  veins in the anus (hemorrhoids), take a warm sitz bath 2-3 times a day. In a sitz bath, the warm water should only come up to your hips and cover your buttocks.   Breast care  In the first few days after delivery, your breasts may feel heavy, full, and uncomfortable (breast engorgement). Milk may also leak from your breasts. Ask your health care provider about ways to help relieve the discomfort.  If you are breastfeeding: ? Wear a bra that supports your breasts and fits well. Use breast pads to absorb milk that leaks. ? Keep your nipples clean and dry. Apply creams and ointments as told. ? You may have uterine contractions every time you breastfeed for up to several weeks after delivery. This helps your uterus return to its normal size. ? If you have any problems with breastfeeding, notify your health care provider or lactation consultant.  If you are not breastfeeding: ? Avoid touching your breasts. Do not squeeze out (express) milk. Doing this can make your breasts produce more milk. ? Wear a good-fitting bra and use cold packs to help with swelling. Intimacy and sexuality  Ask your health care provider when you can engage in sexual activity. This may depend upon: ? Your risk of infection. ? How fast you are healing. ? Your comfort and desire to engage in sexual activity.  You are able to get pregnant after delivery, even if you have not had your period. Talk with your health care provider about methods of birth control (contraception) or family planning if you desire future pregnancies. Medicines  Take over-the-counter and prescription medicines only as told by your health care provider.  Take an over-the-counter stool softener to help ease bowel movements as told by your health care provider.  If you were prescribed an antibiotic medicine, take it as told by your health care provider. Do not stop taking the antibiotic even if you start to feel better.  Review all previous and  current prescriptions to check for possible transfer into breast milk. Activity  Gradually return to your normal activities as told by your health care provider.  Rest as much as possible. Nap while your baby is sleeping. Eating and drinking  Drink enough fluid to keep your urine pale yellow.  To help prevent or relieve constipation, eat high-fiber foods every day.  Choose healthy eating to support breastfeeding or weight loss  goals.  Take your prenatal vitamins until your health care provider tells you to stop.   General tips/recommendations  Do not use any products that contain nicotine or tobacco. These products include cigarettes, chewing tobacco, and vaping devices, such as e-cigarettes. If you need help quitting, ask your health care provider.  Do not drink alcohol, especially if you are breastfeeding.  Do not take medications or drugs that are not prescribed to you, especially if you are breastfeeding.  Visit your health care provider for a postpartum checkup within the first 3-6 weeks after delivery.  Complete a comprehensive postpartum visit no later than 12 weeks after delivery.  Keep all follow-up visits for you and your baby. Contact a health care provider if:  You feel unusually sad or worried.  Your breasts become red, painful, or hard.  You have a fever or other signs of an infection.  You have bleeding that is soaking through one pad an hour or you have blood clots.  You have a severe headache that doesn't go away or you have vision changes.  You have nausea and vomiting and are unable to eat or drink anything for 24 hours. Get help right away if:  You have chest pain or difficulty breathing.  You have sudden, severe leg pain.  You faint or have a seizure.  You have thoughts about hurting yourself or your baby. If you ever feel like you may hurt yourself or others, or have thoughts about taking your own life, get help right away. Go to your nearest  emergency department or:  Call your local emergency services (911 in the [place]  The National Suicide Prevention Lifeline at 559-880-4276. This suicide crisis helpline is open 24 hours a day.  Text the Crisis Text Line at 832-426-2776 (in the [place].). Summary  The period of time after you deliver your newborn up to 6-12 weeks after delivery is called the postpartum period.  Keep all follow-up visits for you and your baby.  Review all previous and current prescriptions to check for possible transfer into breast milk.  Contact a health care provider if you feel unusually sad or worried during the postpartum period. This information is not intended to replace advice given to you by your health care provider. Make sure you discuss any questions you have with your health care provider. Document Revised: 02/22/2020 Document Reviewed: 02/22/2020 Elsevier Patient Education  2021 [place].    Postpartum Baby Blues The postpartum period begins right after the birth of a baby. During this time, there is often joy and excitement. It is also a time of many changes in the life of the parents. A mother may feel happy one minute and sad or stressed the next. These feelings of sadness, called the baby blues, usually happen in the period right after the baby is born and go away within a week or two. What are the causes? The exact cause of this condition is not known. Changes in hormone levels after childbirth are believed to trigger some of the symptoms. Other factors that can play a role in these mood changes include:  Lack of sleep.  Stressful life events, such as financial problems, caring for a loved one, or death of a loved one.  Genetics. What are the signs or symptoms? Symptoms of this condition include:  Changes in mood, such as going from extreme happiness to sadness.  A decrease in concentration.  Difficulty sleeping.  Crying spells and tearfulness.  Loss of  appetite.  Irritability.  Anxiety. If these symptoms last for more than 2 weeks or become more severe, you may have postpartum depression. How is this diagnosed? This condition is diagnosed based on an evaluation of your symptoms. Your health care provider may use a screening tool that includes a list of questions to help identify a person with the baby blues or postpartum depression. How is this treated? The baby blues usually go away on their own in 1-2 weeks. Social support is often what is needed. You will be encouraged to get adequate sleep and rest. Follow these instructions at home: Lifestyle  Get as much rest as you can. Take a nap when the baby sleeps.  Exercise regularly as told by your health care provider. Some women find yoga and walking to be helpful.  Eat a balanced and nourishing diet. This includes plenty of fruits and vegetables, whole grains, and lean proteins.  Do little things that you enjoy. Take a bubble bath, read your favorite magazine, or listen to your favorite music.  Avoid alcohol.  Ask for help with household chores, cooking, grocery shopping, or running errands. Do not try to do everything yourself. Consider hiring a postpartum doula to help. This is a professional who specializes in providing support to new mothers.  Try not to make any major life changes during pregnancy or right after giving birth. This can add stress.      General instructions  Talk to people close to you about how you are feeling. Get support from your partner, family members, friends, or other new moms. You may want to join a support group.  Find ways to manage stress. This may include: ? Writing your thoughts and feelings in a journal. ? Spending time outside. ? Spending time with people who make you laugh.  Try to stay positive in how you think. Think about the things you are grateful for.  Take over-the-counter and prescription medicines only as told by your health care  provider.  Let your health care provider know if you have any concerns.  Keep all postpartum visits. This is important. Contact a health care provider if:  Your baby blues do not go away after 2 weeks. Get help right away if:  You have thoughts of taking your own life (suicidal thoughts), or of harming your baby or someone else.  You see or hear things that are not there (hallucinations). If you ever feel like you may hurt yourself or others, or have thoughts about taking your own life, get help right away. Go to your nearest emergency department or:  Call your local emergency services (911 in the [place]  Call a suicide crisis helpline, such as the [place], at 364-726-8642. This is open 24 hours a day in the [place]  Text the Crisis Text Line at 2526666693 (in the [place].). Summary  After giving birth, you may feel happy one minute and sad or stressed the next. Feelings of sadness that happen right after the baby is born and go away after a week or two are called the baby blues.  You can manage the baby blues by getting enough rest, eating a healthy diet, exercising, spending time with supportive people, and finding ways to manage stress.  If feelings of sadness and stress last longer than 2 weeks or get in the way of caring for your baby, talk with your health care provider. This may mean you have postpartum depression. This information is not intended to replace advice given  to you by your health care provider. Make sure you discuss any questions you have with your health care provider. Document Revised: 12/01/2019 Document Reviewed: 12/01/2019 Elsevier Patient Education  2021 [employer].

## 2020-09-30 ENCOUNTER — Encounter: Payer: Self-pay | Admitting: Certified Nurse Midwife

## 2020-09-30 ENCOUNTER — Other Ambulatory Visit: Payer: Self-pay

## 2020-09-30 ENCOUNTER — Ambulatory Visit (INDEPENDENT_AMBULATORY_CARE_PROVIDER_SITE_OTHER): Payer: BC Managed Care – PPO | Admitting: Certified Nurse Midwife

## 2020-09-30 DIAGNOSIS — Z3009 Encounter for other general counseling and advice on contraception: Secondary | ICD-10-CM

## 2020-09-30 LAB — POCT URINE PREGNANCY: Preg Test, Ur: NEGATIVE

## 2020-09-30 MED ORDER — NORETHINDRONE 0.35 MG PO TABS: 1.0000 | ORAL_TABLET | Freq: Every day | ORAL | 11 refills | Status: DC

## 2020-09-30 NOTE — Progress Notes (Signed)
Subjective:    Martha Finley is a 25 y.o. G74P1001 Caucasian female who presents for a postpartum visit. She is 6 weeks postpartum following a vaginal delivery with vacuum assist  at 39.1 gestational weeks. Anesthesia: epidural. I have fully reviewed the prenatal and intrapartum course. Postpartum course has been normal Baby's course has been normal. Baby is feeding by breast/pumping. Bleeding no bleeding. Bowel function is normal. Bladder function is normal. Patient is sexually active. Last sexual activity: a week ago, used plan B. Contraception method is none. Postpartum depression screening: negative. Score 0.  Last pap 08/14/20 and was negative.  The following portions of the patient's history were reviewed and updated as appropriate: allergies, current medications, past medical history, past surgical history and problem list.  Review of Systems Pertinent items are noted in HPI.   Vitals:   09/30/20 1116  BP: 120/79  Pulse: 76  Weight: 208 lb 3.2 oz (94.4 kg)  Height: 5\' 6"  (1.676 m)   No LMP recorded.  Objective:   General:  alert, cooperative and no distress   Breasts:  deferred, no complaints  Lungs: clear to auscultation bilaterally  Heart:  regular rate and rhythm  Abdomen: soft, nontender   Vulva: normal  Vagina: normal vagina  Cervix:  closed  Corpus: Well-involuted  Adnexa:  Non-palpable  Rectal Exam: No hemorrhoids        Assessment:   Postpartum exam 6 wks s/p Vaginal Delivery with Vacuum assist  Pumping  Depression screening Contraception counseling   Plan:  : oral progesterone-only contraceptive, starting-orders placed  Follow up in: 9 months for annual or earlier if needed  , CNM

## 2020-09-30 NOTE — Patient Instructions (Signed)
Preventive Care 21-27 Years Old, Female Preventive care refers to lifestyle choices and visits with your health care provider that can promote health and wellness. This includes:  A yearly physical exam. This is also called an annual wellness visit.  Regular dental and eye exams.  Immunizations.  Screening for certain conditions.  Healthy lifestyle choices, such as: ? Eating a healthy diet. ? Getting regular exercise. ? Not using drugs or products that contain nicotine and tobacco. ? Limiting alcohol use. What can I expect for my preventive care visit? Physical exam Your health care provider may check your:  Height and weight. These may be used to calculate your BMI (body mass index). BMI is a measurement that tells if you are at a healthy weight.  Heart rate and blood pressure.  Body temperature.  Skin for abnormal spots. Counseling Your health care provider may ask you questions about your:  Past medical problems.  Family's medical history.  Alcohol, tobacco, and drug use.  Emotional well-being.  Home life and relationship well-being.  Sexual activity.  Diet, exercise, and sleep habits.  Work and work environment.  Access to firearms.  Method of birth control.  Menstrual cycle.  Pregnancy history. What immunizations do I need? Vaccines are usually given at various ages, according to a schedule. Your health care provider will recommend vaccines for you based on your age, medical history, and lifestyle or other factors, such as travel or where you work.   What tests do I need? Blood tests  Lipid and cholesterol levels. These may be checked every 5 years starting at age 20.  Hepatitis C test.  Hepatitis B test. Screening  Diabetes screening. This is done by checking your blood sugar (glucose) after you have not eaten for a while (fasting).  STD (sexually transmitted disease) testing, if you are at risk.  BRCA-related cancer screening. This may be  done if you have a family history of breast, ovarian, tubal, or peritoneal cancers.  Pelvic exam and Pap test. This may be done every 3 years starting at age 21. Starting at age 30, this may be done every 5 years if you have a Pap test in combination with an HPV test. Talk with your health care provider about your test results, treatment options, and if necessary, the need for more tests.   Follow these instructions at home: Eating and drinking  Eat a healthy diet that includes fresh fruits and vegetables, whole grains, lean protein, and low-fat dairy products.  Take vitamin and mineral supplements as recommended by your health care provider.  Do not drink alcohol if: ? Your health care provider tells you not to drink. ? You are pregnant, may be pregnant, or are planning to become pregnant.  If you drink alcohol: ? Limit how much you have to 0-1 drink a day. ? Be aware of how much alcohol is in your drink. In the [place] one drink equals one 12 oz bottle of beer (355 mL), one 5 oz glass of wine (148 mL), or one 1 oz glass of hard liquor (44 mL).   Lifestyle  Take daily care of your teeth and gums. Brush your teeth every morning and night with fluoride toothpaste. Floss one time each day.  Stay active. Exercise for at least 30 minutes 5 or more days each week.  Do not use any products that contain nicotine or tobacco, such as cigarettes, e-cigarettes, and chewing tobacco. If you need help quitting, ask your health care provider.  Do not   use drugs.  If you are sexually active, practice safe sex. Use a condom or other form of protection to prevent STIs (sexually transmitted infections).  If you do not wish to become pregnant, use a form of birth control. If you plan to become pregnant, see your health care provider for a prepregnancy visit.  Find healthy ways to cope with stress, such as: ? Meditation, yoga, or listening to music. ? Journaling. ? Talking to a trusted  person. ? Spending time with friends and family. Safety  Always wear your seat belt while driving or riding in a vehicle.  Do not drive: ? If you have been drinking alcohol. Do not ride with someone who has been drinking. ? When you are tired or distracted. ? While texting.  Wear a helmet and other protective equipment during sports activities.  If you have firearms in your house, make sure you follow all gun safety procedures.  Seek help if you have been physically or sexually abused. What's next?  Go to your health care provider once a year for an annual wellness visit.  Ask your health care provider how often you should have your eyes and teeth checked.  Stay up to date on all vaccines. This information is not intended to replace advice given to you by your health care provider. Make sure you discuss any questions you have with your health care provider. Document Revised: 02/04/2020 Document Reviewed: 02/17/2018 Elsevier Patient Education  2021 Elsevier Inc.  

## 2020-09-30 NOTE — Progress Notes (Signed)
Pt present for postpartum care. Pt stated that she is pumping and not breastfeeding, pt had sex since the birth of the baby no protection was used; pt used a Plan B afterwards, pt would like OCP for birth control UPT-NEG. GAD-7=0; PHQ-9=0.

## 2020-10-28 ENCOUNTER — Telehealth: Payer: Self-pay | Admitting: Certified Nurse Midwife

## 2020-10-28 NOTE — Telephone Encounter (Signed)
New Message:   Pt called and is requesting a prescription for a breast pump.  She stated that the prescription can be faxed to [phone].

## 2021-05-20 ENCOUNTER — Ambulatory Visit (INDEPENDENT_AMBULATORY_CARE_PROVIDER_SITE_OTHER): Payer: BC Managed Care – PPO | Admitting: Certified Nurse Midwife

## 2021-05-20 ENCOUNTER — Other Ambulatory Visit: Payer: Self-pay

## 2021-05-20 ENCOUNTER — Encounter: Payer: Self-pay | Admitting: Certified Nurse Midwife

## 2021-05-20 VITALS — BP 133/81 | HR 106 | Ht 66.0 in | Wt 216.5 lb

## 2021-05-20 DIAGNOSIS — Z01419 Encounter for gynecological examination (general) (routine) without abnormal findings: Secondary | ICD-10-CM | POA: Diagnosis not present

## 2021-05-20 DIAGNOSIS — E669 Obesity, unspecified: Secondary | ICD-10-CM | POA: Insufficient documentation

## 2021-05-20 MED ORDER — NORETHIN ACE-ETH ESTRAD-FE 1-20 MG-MCG PO TABS: 1.0000 | ORAL_TABLET | Freq: Every day | ORAL | 11 refills | Status: DC

## 2021-05-20 NOTE — Progress Notes (Signed)
GYNECOLOGY ANNUAL PREVENTATIVE CARE ENCOUNTER NOTE  History:     Martha Finley is a 25 y.o. G66P1001 female here for a routine annual gynecologic exam.  Current complaints: none.   Denies abnormal vaginal bleeding, discharge, pelvic pain, problems with intercourse or other gynecologic concerns.     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 Contraception: oral progesterone-only contraceptive, no longer lactating switch to OCP Last Pap: 07/12/18. Results were: normal with negative HPV Last mammogram: n/a .    Upstream - 05/20/21 1349       Pregnancy Intention Screening   Does the patient want to become pregnant in the next year? No    Does the patient's partner want to become pregnant in the next year? No    Would the patient like to discuss contraceptive options today? N/A            The pregnancy intention screening data noted above was reviewed. Potential methods of contraception were discussed. The patient elected to proceed with OCP.   Obstetric History OB History  Gravida Para Term Preterm AB Living  1 1 1  0 0 1  SAB IAB Ectopic Multiple Live Births  0 0 0 0 1    # Outcome Date GA Lbr Len/2nd Weight Sex Delivery Anes PTL Lv  1 Term 08/14/20 [redacted]w[redacted]d 08:05 / 03:24 8 lb 6.8 oz (3.82 kg) M Vag-Spont EPI  LIV    Past Medical History:  Diagnosis Date   Gestational diabetes    Known health problems: none     Past Surgical History:  Procedure Laterality Date   NO PAST SURGERIES     WISDOM TOOTH EXTRACTION      Current Outpatient Medications on File Prior to Visit  Medication Sig Dispense Refill   norethindrone (MICRONOR) 0.35 MG tablet Take 1 tablet (0.35 mg total) by mouth daily. 30 tablet 11   acetaminophen (TYLENOL) 500 MG tablet Take 2 tablets (1,000 mg total) by mouth every 6  (six) hours as needed for moderate pain. (Patient not taking: Reported on 05/20/2021) 60 tablet 1   Bacillus Coagulans-Inulin (PROBIOTIC) 1-250 BILLION-MG CAPS Take 1 capsule by mouth daily. (Patient not taking: Reported on 05/20/2021)     docusate sodium (COLACE) 100 MG capsule Take 1 capsule (100 mg total) by mouth 2 (two) times daily. (Patient not taking: Reported on 05/20/2021) 10 capsule 0   folic acid (FOLVITE) 1 MG tablet Take 800 mcg by mouth daily.  (Patient not taking: Reported on 05/20/2021)     glucose blood test strip Use as instructed (Patient not taking: Reported on 05/20/2021) 100 each 12   ibuprofen (ADVIL) 600 MG tablet Take 1 tablet (600 mg total) by mouth every 6 (six) hours. (Patient not taking: Reported on 05/20/2021) 30 tablet 0   Microlet Lancets MISC 4 Devices by Does not apply route in the morning, at noon, in the evening, and at bedtime. (Patient not taking: Reported on 05/20/2021) 100 each 12   Prenatal Vit-Fe Fumarate-FA (PRENATAL MULTIVITAMIN) TABS tablet Take 1 tablet by mouth daily at 12 noon. (Patient not taking: Reported on 05/20/2021)     witch hazel-glycerin (TUCKS) pad Apply 1 application topically as needed for hemorrhoids. (Patient not taking: Reported on 05/20/2021) 40 each 12   No current facility-administered medications on file prior to visit.    No Known  Allergies  Social History:  reports that she quit smoking about 17 months ago. Her smoking use included cigarettes and e-cigarettes. She has a 5.00 pack-year smoking history. She has never used smokeless tobacco. She reports that she does not currently use alcohol. She reports that she does not currently use drugs after having used the following drugs: Marijuana.  Family History  Problem Relation Age of Onset   Diabetes Maternal Grandmother    COPD Maternal Grandmother    Breast cancer Maternal Grandmother     The following portions of the patient's history were reviewed and updated as appropriate:  allergies, current medications, past family history, past medical history, past social history, past surgical history and problem list.  Review of Systems Pertinent items noted in HPI and remainder of comprehensive ROS otherwise negative.  Physical Exam:  BP 133/81   Pulse (!) 106   Ht 5\' 6"  (1.676 m)   Wt 216 lb 8 oz (98.2 kg)   LMP 05/13/2021 (Exact Date)   BMI 34.94 kg/m  CONSTITUTIONAL: Well-developed, well-nourished female in no acute distress.  HENT:  Normocephalic, atraumatic, External right and left ear normal. Oropharynx is clear and moist EYES: Conjunctivae and EOM are normal. Pupils are equal, round, and reactive to light. No scleral icterus.  NECK: Normal range of motion, supple, no masses.  Normal thyroid.  SKIN: Skin is warm and dry. No rash noted. Not diaphoretic. No erythema. No pallor. MUSCULOSKELETAL: Normal range of motion. No tenderness.  No cyanosis, clubbing, or edema.  2+ distal pulses. NEUROLOGIC: Alert and oriented to person, place, and time. Normal reflexes, muscle tone coordination.  PSYCHIATRIC: Normal mood and affect. Normal behavior. Normal judgment and thought content. CARDIOVASCULAR: Normal heart rate noted, regular rhythm RESPIRATORY: Clear to auscultation bilaterally. Effort and breath sounds normal, no problems with respiration noted. BREASTS: Symmetric in size. No masses, tenderness, skin changes, nipple drainage, or lymphadenopathy bilaterally.  ABDOMEN: Soft, no distention noted.  No tenderness, rebound or guarding.  PELVIC: Normal appearing external genitalia and urethral meatus; normal appearing vaginal mucosa and cervix.  No abnormal discharge note.  Pap smear not due .  Normal uterine size, no other palpable masses, no uterine or adnexal tenderness.  .   Assessment and Plan:    1. Women's annual routine gynecological examination  . Pap: not due  Mammogram : n/a  Labs:declines today  Refills: ocp  Referral: none Interested in weight loss  program. Has done phetermine in the past with no issues. Pt encouraged to follow up for wt loss visit.  Routine preventative health maintenance measures emphasized. Please refer to After Visit Summary for other counseling recommendations.      05/15/2021, CNM [facility],  [facility]

## 2021-05-20 NOTE — Patient Instructions (Signed)

## 2021-06-03 ENCOUNTER — Encounter: Payer: BC Managed Care – PPO | Admitting: Certified Nurse Midwife

## 2021-07-28 ENCOUNTER — Encounter: Payer: Self-pay | Admitting: Certified Nurse Midwife

## 2022-04-27 ENCOUNTER — Telehealth: Payer: Self-pay | Admitting: Certified Nurse Midwife

## 2022-04-27 NOTE — Telephone Encounter (Signed)
Pt is scheduled with Philip Aspen on 12/13 at 2:30 for an annual exam.  Reaching out to pt to see if she can come at 2:15 that same day.  (Making a shift in provider schedule.)  Left message for pt to call back.

## 2022-04-29 NOTE — Telephone Encounter (Signed)
Pt verified the adjustment to the appt time for her appt with Pattricia Boss on 12/13 at 2:15.  She is also aware of the new location.

## 2022-05-04 ENCOUNTER — Other Ambulatory Visit: Payer: Self-pay | Admitting: Certified Nurse Midwife

## 2022-05-04 ENCOUNTER — Telehealth: Payer: Self-pay | Admitting: Certified Nurse Midwife

## 2022-05-04 MED ORDER — NORETHIN ACE-ETH ESTRAD-FE 1-20 MG-MCG PO TABS: 1.0000 | ORAL_TABLET | Freq: Every day | ORAL | 3 refills | Status: DC

## 2022-05-04 NOTE — Telephone Encounter (Signed)
This pt is scheduled for her annual on 12/13 with you.  She is needing for her BC to be refilled.  Can she get her BC refilled to hold her over until she has her appt?

## 2022-06-03 ENCOUNTER — Encounter: Payer: Self-pay | Admitting: Certified Nurse Midwife

## 2022-06-03 ENCOUNTER — Ambulatory Visit (INDEPENDENT_AMBULATORY_CARE_PROVIDER_SITE_OTHER): Payer: BC Managed Care – PPO | Admitting: Certified Nurse Midwife

## 2022-06-03 VITALS — BP 126/84 | HR 103 | Ht 66.0 in | Wt 211.0 lb

## 2022-06-03 DIAGNOSIS — Z01419 Encounter for gynecological examination (general) (routine) without abnormal findings: Secondary | ICD-10-CM | POA: Diagnosis not present

## 2022-06-03 DIAGNOSIS — Z124 Encounter for screening for malignant neoplasm of cervix: Secondary | ICD-10-CM

## 2022-06-03 MED ORDER — NORETHIN ACE-ETH ESTRAD-FE 1-20 MG-MCG PO TABS: 1.0000 | ORAL_TABLET | Freq: Every day | ORAL | 3 refills | Status: DC

## 2022-06-03 NOTE — Progress Notes (Signed)
GYNECOLOGY ANNUAL PREVENTATIVE CARE ENCOUNTER NOTE  History:     Martha Finley is a 26 y.o. G48P1001 female here for a routine annual gynecologic exam.  Current complaints: none.   Denies abnormal vaginal bleeding, discharge, pelvic pain, problems with intercourse or other gynecologic concerns.     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 Contraception: OCP (estrogen/progesterone) Last Pap: 1/2112020. Results were: normal  Last mammogram: n/a   Upstream - 06/03/22 1425       Pregnancy Intention Screening   Does the patient want to become pregnant in the next year? No    Does the patient's partner want to become pregnant in the next year? No    Would the patient like to discuss contraceptive options today? No      Contraception Wrap Up   Current Method Oral Contraceptive    End Method Oral Contraceptive    Contraception Counseling Provided No            The pregnancy intention screening data noted above was reviewed. Potential methods of contraception were discussed. The patient elected to proceed with Oral Contraceptive.   Obstetric History OB History  Gravida Para Term Preterm AB Living  1 1 1  0 0 1  SAB IAB Ectopic Multiple Live Births  0 0 0 0 1    # Outcome Date GA Lbr Len/2nd Weight Sex Delivery Anes PTL Lv  1 Term 08/14/20 [redacted]w[redacted]d 08:05 / 03:24 8 lb 6.8 oz (3.82 kg) M Vag-Spont EPI  LIV    Past Medical History:  Diagnosis Date   Gestational diabetes    Known health problems: none     Past Surgical History:  Procedure Laterality Date   NO PAST SURGERIES     WISDOM TOOTH EXTRACTION      Current Outpatient Medications on File Prior to Visit  Medication Sig Dispense Refill   norethindrone-ethinyl estradiol-FE (JUNEL FE 1/20) 1-20 MG-MCG tablet Take 1 tablet by mouth daily. 28 tablet 3    No current facility-administered medications on file prior to visit.    No Known Allergies  Social History:  reports that she quit smoking about 2 years ago. Her smoking use included cigarettes and e-cigarettes. She has a 5.00 pack-year smoking history. She has never used smokeless tobacco. She reports that she does not currently use alcohol. She reports that she does not currently use drugs after having used the following drugs: Marijuana.  Family History  Problem Relation Age of Onset   Diabetes Maternal Grandmother    COPD Maternal Grandmother    Breast cancer Maternal Grandmother     The following portions of the patient's history were reviewed and updated as appropriate: allergies, current medications, past family history, past medical history, past social history, past surgical history and problem list.  Review of Systems Pertinent items noted in HPI and remainder of comprehensive ROS otherwise negative.  Physical Exam:  BP 126/84   Pulse (!) 103   Ht 5\' 6"  (1.676 m)   Wt 211 lb (95.7 kg)   LMP 05/28/2022   BMI 34.06 kg/m  CONSTITUTIONAL: Well-developed, well-nourished female in no acute distress.  HENT:  Normocephalic, atraumatic, External right and left ear normal. Oropharynx is clear and moist EYES: Conjunctivae and EOM are normal. Pupils are equal, round, and reactive to light. No scleral icterus.  NECK: Normal range of  motion, supple, no masses.  Normal thyroid.  SKIN: Skin is warm and dry. No rash noted. Not diaphoretic. No erythema. No pallor. MUSCULOSKELETAL: Normal range of motion. No tenderness.  No cyanosis, clubbing, or edema.  2+ distal pulses. NEUROLOGIC: Alert and oriented to person, place, and time. Normal reflexes, muscle tone coordination.  PSYCHIATRIC: Normal mood and affect. Normal behavior. Normal judgment and thought content. CARDIOVASCULAR: Normal heart rate noted, regular rhythm RESPIRATORY: Clear to auscultation bilaterally. Effort and breath  sounds normal, no problems with respiration noted. BREASTS: Symmetric in size. No masses, tenderness, skin changes, nipple drainage, or lymphadenopathy bilaterally.  ABDOMEN: Soft, no distention noted.  No tenderness, rebound or guarding.  PELVIC: Normal appearing external genitalia and urethral meatus; normal appearing vaginal mucosa and cervix.  No abnormal discharge noted.  Pap smear obtained.  Normal uterine size, no other palpable masses, no uterine or adnexal tenderness.  .   Assessment and Plan:    1. Women's annual routine gynecological examination  Pap: Will follow up results of pap smear and manage accordingly.   Mammogram : n/a  Labs: none  Refills: ocp Referral: none  Routine preventative health maintenance measures emphasized. Please refer to After Visit Summary for other counseling recommendations.      Doreene Burke, CNM [facility]  [facility],  [facility]

## 2022-06-03 NOTE — Patient Instructions (Signed)

## 2022-07-20 ENCOUNTER — Telehealth: Payer: Self-pay

## 2022-07-20 NOTE — Telephone Encounter (Signed)
Pt calling for results from 06/03/22; hasn't seen anything on MyChart.  [phone]

## 2022-07-20 NOTE — Telephone Encounter (Signed)
Spoke with [facility] in [facility]. They do not have record of receiving pap on this patient. Specimen will need to be recollected.

## 2022-07-21 NOTE — Telephone Encounter (Signed)
Patient is scheduled for 2/16 with AT. 

## 2022-07-21 NOTE — Telephone Encounter (Signed)
Left detailed msg on phone and also sent a MyChart msg.

## 2022-08-07 ENCOUNTER — Other Ambulatory Visit (HOSPITAL_COMMUNITY)
Admission: RE | Admit: 2022-08-07 | Discharge: 2022-08-07 | Disposition: A | Discharge status: Home / Self Care | Payer: BC Managed Care – PPO | Source: Ambulatory Visit | Attending: Certified Nurse Midwife | Admitting: Certified Nurse Midwife

## 2022-08-07 ENCOUNTER — Encounter: Payer: Self-pay | Admitting: Certified Nurse Midwife

## 2022-08-07 ENCOUNTER — Ambulatory Visit (INDEPENDENT_AMBULATORY_CARE_PROVIDER_SITE_OTHER): Payer: BC Managed Care – PPO | Admitting: Certified Nurse Midwife

## 2022-08-07 VITALS — BP 121/85 | HR 106 | Wt 213.6 lb

## 2022-08-07 DIAGNOSIS — Z124 Encounter for screening for malignant neoplasm of cervix: Secondary | ICD-10-CM | POA: Diagnosis present

## 2022-08-07 NOTE — Progress Notes (Signed)
GYN ENCOUNTER NOTE  Subjective:       Martha Finley is a 27 y.o. G74P1001 female is here for gynecologic evaluation of the following issues:  1.   Pt was seen for annual and pap was collected at that time, however the sample was lost. She presents today for a pap only  Gynecologic History Patient's last menstrual period was 08/07/2022 (exact date). Contraception: OCP (estrogen/progesterone) Last Pap: 07/12/18. Results were: normal   Obstetric History OB History  Gravida Para Term Preterm AB Living  1 1 1 $ 0 0 1  SAB IAB Ectopic Multiple Live Births  0 0 0 0 1    # Outcome Date GA Lbr Len/2nd Weight Sex Delivery Anes PTL Lv  1 Term 08/14/20 [id number]:05 / 03:24 8 lb 6.8 oz (3.82 kg) M Vag-Spont EPI  LIV    Past Medical History:  Diagnosis Date   Gestational diabetes    Known health problems: none     Past Surgical History:  Procedure Laterality Date   NO PAST SURGERIES     WISDOM TOOTH EXTRACTION      Current Outpatient Medications on File Prior to Visit  Medication Sig Dispense Refill   norethindrone-ethinyl estradiol-FE (JUNEL FE 1/20) 1-20 MG-MCG tablet Take 1 tablet by mouth daily. 90 tablet 3   No current facility-administered medications on file prior to visit.    No Known Allergies  Social History   Socioeconomic History   Marital status: Married    Spouse name: Not on file   Number of children: Not on file   Years of education: Not on file   Highest education level: Not on file  Occupational History   Not on file  Tobacco Use   Smoking status: Former    Packs/day: 0.50    Years: 10.00    Total pack years: 5.00    Types: Cigarettes, E-cigarettes    Quit date: 11/21/2019    Years since quitting: 2.7   Smokeless tobacco: Never  Vaping Use   Vaping Use: Former  Substance and Sexual Activity   Alcohol use: Not Currently   Drug use: Not Currently    Types: Marijuana   Sexual activity: Yes    Birth control/protection: Pill  Other Topics Concern   Not on  file  Social History Narrative   Not on file   Social Determinants of Health   Financial Resource Strain: Not on file  Food Insecurity: Not on file  Transportation Needs: Not on file  Physical Activity: Not on file  Stress: Not on file  Social Connections: Not on file  Intimate Partner Violence: Not on file    Family History  Problem Relation Age of Onset   Diabetes Maternal Grandmother    COPD Maternal Grandmother    Breast cancer Maternal Grandmother     The following portions of the patient's history were reviewed and updated as appropriate: allergies, current medications, past family history, past medical history, past social history, past surgical history and problem list.  Review of Systems Review of Systems -  negative   Objective:   BP 121/85   Pulse (!) 106   Wt 213 lb 9.6 oz (96.9 kg)   LMP 08/07/2022 (Exact Date)   Breastfeeding No   BMI 34.48 kg/m  CONSTITUTIONAL: Well-developed, well-nourished female in no acute distress.  HENT:  Normocephalic, atraumatic.  SKIN: Skin is warm and dry. No rash noted. Not diaphoretic. No erythema. No pallor.  PELVIC:  External Genitalia: Normal  BUS: Normal  Vagina: Normal  Cervix: Normal, small amount of blood , pt on period. Pap collected   MUSCULOSKELETAL: Normal range of motion. No tenderness.  No cyanosis, clubbing, or edema.   Assessment:   1. Pap smear for cervical cancer screening    Plan:   Pap collected, will follow up with results.   NO charge visit.   Philip Aspen, CNM

## 2022-08-11 LAB — CYTOLOGY - PAP
Chlamydia: NEGATIVE
Comment: NEGATIVE
Comment: NEGATIVE
Comment: NORMAL
Diagnosis: NEGATIVE
Neisseria Gonorrhea: NEGATIVE
Trichomonas: NEGATIVE

## 2023-07-10 ENCOUNTER — Other Ambulatory Visit: Payer: Self-pay | Admitting: Certified Nurse Midwife

## 2023-07-12 ENCOUNTER — Encounter: Payer: Self-pay | Admitting: Certified Nurse Midwife

## 2023-07-14 ENCOUNTER — Other Ambulatory Visit: Payer: Self-pay | Admitting: Certified Nurse Midwife

## 2023-07-14 MED ORDER — NORETHIN ACE-ETH ESTRAD-FE 1-20 MG-MCG PO TABS: 1.0000 | ORAL_TABLET | Freq: Every day | ORAL | 3 refills | Status: DC

## 2023-09-16 ENCOUNTER — Encounter: Payer: Self-pay | Admitting: Certified Nurse Midwife

## 2023-09-16 ENCOUNTER — Ambulatory Visit (INDEPENDENT_AMBULATORY_CARE_PROVIDER_SITE_OTHER): Payer: BC Managed Care – PPO | Admitting: Certified Nurse Midwife

## 2023-09-16 VITALS — BP 132/87 | HR 87 | Ht 66.0 in | Wt 207.7 lb

## 2023-09-16 DIAGNOSIS — Z01419 Encounter for gynecological examination (general) (routine) without abnormal findings: Secondary | ICD-10-CM | POA: Diagnosis not present

## 2023-09-16 MED ORDER — NORETHIN ACE-ETH ESTRAD-FE 1-20 MG-MCG PO TABS: 1.0000 | ORAL_TABLET | Freq: Every day | ORAL | 3 refills | Status: AC

## 2023-09-16 NOTE — Progress Notes (Signed)
 GYNECOLOGY ANNUAL PREVENTATIVE CARE ENCOUNTER NOTE  History:     Martha Finley is a 28 y.o. G110P1001 female here for a routine annual gynecologic exam.  Current complaints: none.   Denies abnormal vaginal bleeding, discharge, pelvic pain, problems with intercourse or other gynecologic concerns.     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 No    Does the patient's partner want to become pregnant in the next year? No    Would the patient like to discuss contraceptive options today? No      Contraception Wrap Up   Current Method Oral Contraceptive    End Method Oral Contraceptive            The pregnancy intention screening data noted above was reviewed. Potential methods of contraception were discussed. The patient elected to proceed with Oral Contraceptive.   Gynecologic History Patient's last menstrual period was 08/27/2023 (within weeks). Contraception: OCP (estrogen/progesterone) Last Pap: 08/07/2022. Results were: normal  Last mammogram: n/a.    Obstetric History OB History  Gravida Para Term Preterm AB Living  1 1 1  0 0 1  SAB IAB Ectopic Multiple Live Births  0 0 0 0 1    # Outcome Date GA Lbr Len/2nd Weight Sex Type Anes PTL Lv  1 Term 08/14/20 [redacted]w[redacted]d 08:05 / 03:24 8 lb 6.8 oz (3.82 kg) M Vag-Spont EPI  LIV    Past Medical History:  Diagnosis Date   Gestational diabetes    Known health problems: none     Past Surgical History:  Procedure Laterality Date   NO PAST SURGERIES     WISDOM TOOTH EXTRACTION      Current Outpatient Medications on File Prior to Visit  Medication Sig Dispense Refill   norethindrone-ethinyl estradiol-FE (LOESTRIN FE) 1-20 MG-MCG tablet Take 1 tablet by mouth daily. 90 tablet 3   No  current facility-administered medications on file prior to visit.    No Known Allergies  Social History:  reports that she quit smoking about 3 years ago. Her smoking use included cigarettes and e-cigarettes. She started smoking about 13 years ago. She has a 5 pack-year smoking history. She has never used smokeless tobacco. She reports that she does not currently use alcohol. She reports that she does not currently use drugs after having used the following drugs: Marijuana.  Family History  Problem Relation Age of Onset   Diabetes Maternal Grandmother    COPD Maternal Grandmother    Breast cancer Maternal Grandmother     The following portions of the patient's history were reviewed and updated as appropriate: allergies, current medications, past family history, past medical history, past social history, past surgical history and problem list.  Review of Systems Pertinent items noted in HPI and remainder of comprehensive ROS otherwise negative.  Physical Exam:  BP 132/87   Pulse 87   Ht 5\' 6"  (1.676 m)   Wt 207 lb 11.2 oz (94.2 kg)   LMP 08/27/2023 (Within Weeks)   BMI 33.52 kg/m  CONSTITUTIONAL: Well-developed, well-nourished female in no acute distress.  HENT:  Normocephalic, atraumatic, External right and left ear normal. Oropharynx is clear and moist EYES: Conjunctivae and EOM are normal. Pupils are equal, round, and reactive to light.  No scleral icterus.  NECK: Normal range of motion, supple, no masses.  Normal thyroid.  SKIN: Skin is warm and dry. No rash noted. Not diaphoretic. No erythema. No pallor. MUSCULOSKELETAL: Normal range of motion. No tenderness.  No cyanosis, clubbing, or edema.  2+ distal pulses. NEUROLOGIC: Alert and oriented to person, place, and time. Normal reflexes, muscle tone coordination.  PSYCHIATRIC: Normal mood and affect. Normal behavior. Normal judgment and thought content. CARDIOVASCULAR: Normal heart rate noted, regular rhythm RESPIRATORY: Clear to  auscultation bilaterally. Effort and breath sounds normal, no problems with respiration noted. BREASTS: Symmetric in size. No masses, tenderness, skin changes, nipple drainage, or lymphadenopathy bilaterally.  ABDOMEN: Soft, no distention noted.  No tenderness, rebound or guarding.  PELVIC: Normal appearing external genitalia and urethral meatus; normal appearing vaginal mucosa and cervix.  No abnormal discharge noted.  Pap smear not due.  Normal uterine size, no other palpable masses, no uterine or adnexal tenderness.  .   Assessment and Plan:    1. Women's annual routine gynecological examination   Pap: not due until 2027 Mammogram : n/a  Labs: declines  Refills: ocp Referral: none  Routine preventative health maintenance measures emphasized. Please refer to After Visit Summary for other counseling recommendations.      Doreene Burke, CNM [facility]  [facility],  [facility]

## 2023-09-16 NOTE — Progress Notes (Signed)
error 

## 2023-09-16 NOTE — Patient Instructions (Signed)

## 2023-09-21 ENCOUNTER — Ambulatory Visit: Payer: Self-pay | Admitting: Nurse Practitioner

## 2023-09-21 ENCOUNTER — Encounter: Payer: Self-pay | Admitting: Nurse Practitioner

## 2023-09-21 DIAGNOSIS — Z113 Encounter for screening for infections with a predominantly sexual mode of transmission: Secondary | ICD-10-CM

## 2023-09-21 DIAGNOSIS — N912 Amenorrhea, unspecified: Secondary | ICD-10-CM

## 2023-09-21 LAB — HM HEPATITIS C SCREENING LAB: HM Hepatitis Screen: NEGATIVE

## 2023-09-21 LAB — HM HIV SCREENING LAB: HM HIV Screening: NEGATIVE

## 2023-09-21 LAB — PREGNANCY, URINE: Preg Test, Ur: NEGATIVE

## 2023-09-21 LAB — HEPATITIS B SURFACE ANTIGEN

## 2023-09-21 LAB — WET PREP FOR TRICH, YEAST, CLUE
Trichomonas Exam: NEGATIVE
Yeast Exam: NEGATIVE

## 2023-09-21 NOTE — Progress Notes (Signed)
 Pt is here for STD screening , Wet prep results reviewed with pt, no treatment required per standing order. Condoms Given. Sonda Primes, RN.

## 2023-09-21 NOTE — Progress Notes (Signed)
 [facility] Department STI clinic [address] [place] [address] Main phone: [phone]  STI screening visit  Subjective:  Martha Finley is a 28 y.o. female being seen today for an STI screening visit. The patient reports they do not have symptoms.  Patient reports that they do not desire a pregnancy in the next year.   They reported they are not interested in discussing contraception today.    Patient's last menstrual period was 08/30/2023 (within weeks).  Patient has the following medical conditions:  Patient Active Problem List   Diagnosis Date Noted   Obesity (BMI 30-39.9) 05/20/2021   Type A blood, Rh positive 07/26/2020    Chief Complaint  Patient presents with   SEXUALLY TRANSMITTED DISEASE    Patient is a pleasant 28 y.o. female who presents to the office today requesting asymptomatic STI testing. Patient indicates 2 female partners in the last 2 months. She also reports having same sex partners in the past, but not the last 2 months. She reports practicing vaginal and oral sex and does not use condoms. Patient indicates no STI history. Patient reports last sex was 9 days ago. She indicates use of oral contraception method, but takes inconsistently.  Patient indicates LMP was 3/10 and not exact, but "within weeks."   Does the patient using douching products? Not assessed.  Last HIV test per patient/review of record was  Lab Results  Component Value Date   HIV Non Reactive 01/25/2020     Last HEPC test per patient/review of record was No results found for: "HMHEPCSCREEN" No components found for: "HEPC"   Last HEPB test per patient/review of record was No components found for: "HMHEPBSCREEN"   Patient reports last pap was:     Component Value Date/Time   DIAGPAP  08/07/2022 0958    - Negative for intraepithelial lesion or malignancy (NILM)   DIAGPAP  07/12/2018 0000    NEGATIVE FOR INTRAEPITHELIAL LESIONS OR MALIGNANCY.    ADEQPAP  08/07/2022 0958    Satisfactory for evaluation; transformation zone component PRESENT.   ADEQPAP  07/12/2018 0000    Satisfactory for evaluation  endocervical/transformation zone component PRESENT.   No results found for: "SPECADGYN" Result Date Procedure Results Follow-ups  08/07/2022 Cytology - PAP Neisseria Gonorrhea: Negative Chlamydia: Negative Trichomonas: Negative Adequacy: Satisfactory for evaluation; transformation zone component PRESENT. Diagnosis: - Negative for intraepithelial lesion or malignancy (NILM) Comment: Normal Reference Ranger Chlamydia - Negative Comment: Normal Reference Range Neisseria Gonorrhea - Negative Comment: Normal Reference Range Trichomonas - Negative   07/12/2018 Cytology - PAP Adequacy: Satisfactory for evaluation  endocervical/transformation zone component PRESENT. Diagnosis: NEGATIVE FOR INTRAEPITHELIAL LESIONS OR MALIGNANCY. Bacterial vaginitis: POSITIVE for Gardnerella vaginalis (A) Candida vaginitis: Negative for Candida species Chlamydia: Negative Neisseria Gonorrhea: Negative Trichomonas: Negative Material Submitted: CervicoVaginal Pap [ThinPrep Imaged]     Screening for MPX risk: Does the patient have an unexplained rash? No Is the patient MSM? No Does the patient endorse multiple sex partners or anonymous sex partners? Yes Did the patient have close or sexual contact with a person diagnosed with MPX? No Has the patient traveled outside the [place] where MPX is endemic? No Is there a high clinical suspicion for MPX-- evidenced by one of the following No  -Unlikely to be chickenpox  -Lymphadenopathy  -Rash that present in same phase of evolution on any given body part See flowsheet for further details and programmatic requirements.   Immunization history:  Immunization History  Administered Date(s) Administered  Influenza,inj,Quad PF,6+ Mos 03/04/2020   Janssen (J&J) SARS-COV-2 Vaccination 08/22/2019   Tdap 05/29/2020     The  following portions of the patient's history were reviewed and updated as appropriate: allergies, current medications, past medical history, past social history, past surgical history and problem list.  Objective:  There were no vitals filed for this visit.  Physical Exam Nursing note reviewed.  Constitutional:      Appearance: Normal appearance.  HENT:     Head: Normocephalic.     Salivary Glands: Right salivary gland is not diffusely enlarged or tender. Left salivary gland is not diffusely enlarged or tender.     Mouth/Throat:     Lips: Pink. No lesions.     Mouth: Mucous membranes are moist.     Tongue: No lesions. Tongue does not deviate from midline.     Pharynx: Oropharynx is clear. Uvula midline. No oropharyngeal exudate or posterior oropharyngeal erythema.     Tonsils: No tonsillar exudate.  Eyes:     General:        Right eye: No discharge.        Left eye: No discharge.  Pulmonary:     Effort: Pulmonary effort is normal.  Genitourinary:    Comments: Patient asymptomatic. Declines genital exam. Self-swabbing.  Lymphadenopathy:     Head:     Right side of head: No submental, submandibular, tonsillar, preauricular or posterior auricular adenopathy.     Left side of head: No submental, submandibular, tonsillar, preauricular or posterior auricular adenopathy.     Cervical: No cervical adenopathy.     Right cervical: No superficial or posterior cervical adenopathy.    Left cervical: No superficial or posterior cervical adenopathy.     Upper Body:     Right upper body: No supraclavicular or axillary adenopathy.     Left upper body: No supraclavicular or axillary adenopathy.  Skin:    General: Skin is warm and dry.     Comments: Skin tone appropriate for ethnicity. Assessed exposed areas only and back.   Neurological:     Mental Status: She is alert and oriented to person, place, and time.  Psychiatric:        Attention and Perception: Attention and perception normal.         Mood and Affect: Mood and affect normal.        Speech: Speech normal.        Behavior: Behavior normal. Behavior is cooperative.        Thought Content: Thought content normal.     Assessment and Plan:  Martha Finley is a 28 y.o. female presenting to the [facility] Department for STI screening  1. Screening for venereal disease (Primary)  - Chlamydia/Gonorrhea [facility] - Gonococcus culture - HBV Antigen/Antibody [place] Lab - HIV/HCV [place] [facility] - Syphilis Serology,  Lab - WET PREP FOR TRICH, YEAST, CLUE  2. Amenorrhea Per Drema Pry, RN ok to test for pregnancy through urine on an STI visit if it might affect treatment for STI. UPT was ordered under STI visit type as she is inconsistent with OCP and is not sure when her last menstrual cycle began.  UPT negative in office today.  - Pregnancy, urine   Patient accepted all screenings including oral, vaginal CT/GC and bloodwork for HIV/RPR, and wet prep. Patient meets criteria for HepB screening? Yes. Ordered? yes Patient meets criteria for HepC screening? Yes. Ordered? yes  Treat wet prep per standing order Discussed time line  for [facility] results and that patient will be called with positive results and encouraged patient to call if she had not heard in 2 weeks.  Counseled to return or seek care for continued or worsening symptoms Recommended repeat testing in 3 months with positive results. Recommended condom use with all sex for STI prevention.   Patient is currently using  OCP  to prevent pregnancy.    Return if symptoms worsen or fail to improve.  No future appointments.  Total time with patient 20 minutes.   Edmonia James, NP

## 2023-09-28 LAB — GONOCOCCUS CULTURE

## 2023-09-29 ENCOUNTER — Telehealth: Payer: Self-pay

## 2023-09-29 NOTE — Telephone Encounter (Signed)
 Phone call to pt at [phone]. Pt answered and provided password. Counseled pt re + CT result.  Tx appt scheduled for 09/30/23 AM per pt request.

## 2023-09-29 NOTE — Telephone Encounter (Signed)
 Call pt re positive chlamydia results from 09/21/23 vaginal specimen. Needs tx.

## 2023-09-30 ENCOUNTER — Ambulatory Visit

## 2023-09-30 DIAGNOSIS — A749 Chlamydial infection, unspecified: Secondary | ICD-10-CM

## 2023-09-30 MED ORDER — DOXYCYCLINE HYCLATE 100 MG PO TABS
100.0000 mg | ORAL_TABLET | Freq: Two times a day (BID) | ORAL | Status: AC
Start: 2023-09-30 — End: 2023-10-07

## 2023-09-30 NOTE — Progress Notes (Signed)
 In nurse clinic for Chlamydia tx. Patient reports having no symptoms. LMP 08/30/2023. Patient treated today with Doxycycline 100mg  per S.O. by Lorrin Mais, MD.   The patient was dispensed Doxycycline 100mg  #14 today. I provided counseling today regarding the medication. We discussed the medication, the side effects and when to call clinic.   Patient given the opportunity to ask questions. Questions answered.   Abagail Kitchens, RN

## 2023-10-13 ENCOUNTER — Encounter: Payer: Self-pay | Admitting: Certified Nurse Midwife

## 2023-10-14 ENCOUNTER — Other Ambulatory Visit: Payer: Self-pay

## 2023-12-21 ENCOUNTER — Encounter: Payer: Self-pay | Admitting: Family Medicine

## 2023-12-21 ENCOUNTER — Ambulatory Visit: Admitting: Family Medicine

## 2023-12-21 DIAGNOSIS — Z113 Encounter for screening for infections with a predominantly sexual mode of transmission: Secondary | ICD-10-CM

## 2023-12-21 LAB — WET PREP FOR TRICH, YEAST, CLUE
Clue Cell Exam: NEGATIVE
Trichomonas Exam: NEGATIVE
Yeast Exam: NEGATIVE

## 2023-12-21 LAB — HM HIV SCREENING LAB: HM HIV Screening: NEGATIVE

## 2023-12-21 NOTE — Progress Notes (Signed)
 [facility] STI clinic [address] [place] [address] Main phone: [phone]  STI screening visit  Subjective:  Martha Finley is a 28 y.o. female being seen today for an STI screening visit. The patient reports they do not have symptoms.  Patient reports that they do not desire a pregnancy in the next year.   They reported they are not interested in discussing contraception today.    Patient's last menstrual period was 11/30/2023 (approximate).  Patient has the following medical conditions:  Patient Active Problem List   Diagnosis Date Noted   Obesity (BMI 30-39.9) 05/20/2021   Type A blood, Rh positive 07/26/2020   Chief Complaint  Patient presents with   SEXUALLY TRANSMITTED DISEASE    HPI Patient reports to clinic for STI testing. asymptomatic  Does the patient using douching products? No  See flowsheet for further details and programmatic requirements Hyperlink available at the top of the signed note in blue.  Flow sheet content below:  Pregnancy Intention Screening Does the patient want to become pregnant in the next year?: No Does the patient's partner want to become pregnant in the next year?: No Would the patient like to discuss contraceptive options today?: No All Patients Anyone smoke around pt and/or pt's children?: Yes Anyone smoke inside pt's house?: No Anyone smoke inside car?: No Anyone smoke inside the workplace?: No Reason For STD Screen STD Screening: Is asymptomatic Have you ever had an STD?: Yes History of Antibiotic use in the past 2 weeks?: No STD Symptoms Denies all: Yes Risk Factors for Hep B Household, sexual, or needle sharing contact of a person infected with Hep B: No Sexual contact with a person who uses drugs not as prescribed?: No Currently or Ever used drugs not as prescribed: No HIV Positive: No PRep Patient: No Men who have sex with men: No Have Hepatitis C: No History of  Incarceration: No History of Homeslessness?: No Anal sex following anal drug use?: No Risk Factors for Hep C Currently using drugs not as prescribed: No Sexual partner(s) currently using drugs as not prescribed: No History of drug use: No HIV Positive: No People with a history of incarceration: No People born between the years of 9 and 48: N/A Advise Advised client to quit or stay quit. : Yes (vapes') Assess Is patient ready to quit in next 30 days?: No Abuse History Has patient ever been abused physically?: No Has patient ever been abused sexually?: No Does patient feel they have a problem with Anxiety?: No Does patient feel they have a problem with Depression?: No Counseling Patient counseled to use condoms with all sex: Condoms given RTC in 2-3 weeks for test results: Yes Clinic will call if test results abnormal before test result appt.: Yes Test results given to patient Patient counseled to use condoms with all sex: Condoms given   Screening for MPX risk: Does the patient have an unexplained rash? No Is the patient MSM? No Does the patient endorse multiple sex partners or anonymous sex partners? No Did the patient have close or sexual contact with a person diagnosed with MPX? No Has the patient traveled outside the [place]  where MPX is endemic? No Is there a high clinical suspicion for MPX-- evidenced by one of the following No  -Unlikely to be chickenpox  -Lymphadenopathy  -Rash that present in same phase of evolution on any given body part  Screenings: Last HIV test per patient/review of record was  Lab Results  Component Value Date   HMHIVSCREEN Negative - Validated 09/21/2023    Lab Results  Component Value Date   HIV Non Reactive 01/25/2020     Last HEPC test per patient/review of record was  Lab Results  Component Value Date   HMHEPCSCREEN Negative-Validated 09/21/2023   No components found for: HEPC   Last HEPB test per patient/review of record was  No components found for: HMHEPBSCREEN   Patient reports last pap was:   No results found for: SPECADGYN Result Date Procedure Results Follow-ups  08/07/2022 Cytology - PAP Neisseria Gonorrhea: Negative Chlamydia: Negative Trichomonas: Negative Adequacy: Satisfactory for evaluation; transformation zone component PRESENT. Diagnosis: - Negative for intraepithelial lesion or malignancy (NILM) Comment: Normal Reference Ranger Chlamydia - Negative Comment: Normal Reference Range Neisseria Gonorrhea - Negative Comment: Normal Reference Range Trichomonas - Negative   07/12/2018 Cytology - PAP Adequacy: Satisfactory for evaluation  endocervical/transformation zone component PRESENT. Diagnosis: NEGATIVE FOR INTRAEPITHELIAL LESIONS OR MALIGNANCY. Bacterial vaginitis: POSITIVE for Gardnerella vaginalis (A) Candida vaginitis: Negative for Candida species Chlamydia: Negative Neisseria Gonorrhea: Negative Trichomonas: Negative Material Submitted: CervicoVaginal Pap [ThinPrep Imaged]     Immunization history:  Immunization History  Administered Date(s) Administered   Influenza,inj,Quad PF,6+ Mos 03/04/2020   Janssen (J&J) SARS-COV-2 Vaccination 08/22/2019   Tdap 05/29/2020    The following portions of the patient's history were reviewed and updated as appropriate: allergies, current medications, past medical history, past social history, past surgical history and problem list.  Objective:  There were no vitals filed for this visit.  Physical Exam Vitals and nursing note reviewed.  Constitutional:      Appearance: Normal appearance.  HENT:     Head: Normocephalic.     Mouth/Throat:     Mouth: Mucous membranes are moist.   Cardiovascular:     Rate and Rhythm: Normal rate.  Pulmonary:     Effort: Pulmonary effort is normal.  Abdominal:     Palpations: Abdomen is soft.  Genitourinary:    Comments: Declined genital exam- no symptoms, self swabbed  Musculoskeletal:        General:  Normal range of motion.  Lymphadenopathy:     Head:     Right side of head: No submandibular, preauricular or posterior auricular adenopathy.     Left side of head: No submandibular, preauricular or posterior auricular adenopathy.     Cervical: No cervical adenopathy.     Upper Body:     Right upper body: No supraclavicular or axillary adenopathy.     Left upper body: No supraclavicular or axillary adenopathy.   Skin:    General: Skin is warm and dry.   Neurological:     Mental Status: She is alert and oriented to person, place, and time.   Psychiatric:        Mood and Affect: Mood normal.      Assessment and Plan:  Cornelia Walraven is a 28 y.o. female presenting to the [facility] Department for STI screening  1. Screening for venereal disease (Primary)  - Chlamydia/Gonorrhea [place] [facility] - HIV [place] [facility] - Syphilis Serology,  Lab - WET PREP FOR TRICH, YEAST, CLUE - Gonococcus culture   Patient accepted the following screenings: oral GC culture, vaginal CT/GC swab, vaginal wet prep, HIV, and RPR Patient meets criteria for HepB screening? No. Ordered? not applicable Patient meets criteria for HepC screening? No. Ordered? not applicable  Treat wet prep per standing order Discussed time line for [facility] results and that patient  will be called with positive results and encouraged patient to call if she had not heard in 2 weeks.  Counseled to return or seek care for continued or worsening symptoms Recommended repeat testing in 3 months with positive results. Recommended condom use with all sex for STI prevention.   Patient is currently using Hormonal Contraception: Injection, Rings and Patches to prevent pregnancy.    Return if symptoms worsen or fail to improve, for STI screening.  No future appointments.  Verneta Bers, [place]

## 2023-12-21 NOTE — Progress Notes (Signed)
 Pt is here for STD screening. Wet prep results reviewed with pt, no treatment required per standing order. Condoms declined. Sonda Primes, RN.

## 2023-12-25 LAB — GONOCOCCUS CULTURE

## 2023-12-30 ENCOUNTER — Telehealth: Payer: Self-pay | Admitting: Family Medicine

## 2023-12-31 NOTE — Telephone Encounter (Signed)
 Call to client who desires test results. DOB and password ([email or web address]) verified. Counseled pharyngeal gonorrhea culture was negative and that HIV / RPR / gonorrhea / chlamydia test results not yet available from the [facility]. Per client, will check back next week. Burnadette Lowers, RN

## 2024-01-06 ENCOUNTER — Encounter: Payer: Self-pay | Admitting: Family Medicine

## 2024-02-18 ENCOUNTER — Encounter: Payer: Self-pay | Admitting: Certified Nurse Midwife
